# Patient Record
Sex: Female | Born: 1970 | Race: White | Hispanic: No | Marital: Single | Smoking: Never smoker
Health system: Southern US, Community
[De-identification: ages and names within clinical notes are randomized; demographics above are authoritative.]

## PROBLEM LIST (undated history)

## (undated) DIAGNOSIS — F419 Anxiety disorder, unspecified: Secondary | ICD-10-CM

## (undated) DIAGNOSIS — D649 Anemia, unspecified: Secondary | ICD-10-CM

## (undated) DIAGNOSIS — C4491 Basal cell carcinoma of skin, unspecified: Secondary | ICD-10-CM

## (undated) DIAGNOSIS — F329 Major depressive disorder, single episode, unspecified: Secondary | ICD-10-CM

## (undated) DIAGNOSIS — T4145XA Adverse effect of unspecified anesthetic, initial encounter: Secondary | ICD-10-CM

## (undated) DIAGNOSIS — T8859XA Other complications of anesthesia, initial encounter: Secondary | ICD-10-CM

## (undated) DIAGNOSIS — G4730 Sleep apnea, unspecified: Secondary | ICD-10-CM

## (undated) DIAGNOSIS — R51 Headache: Secondary | ICD-10-CM

## (undated) DIAGNOSIS — D219 Benign neoplasm of connective and other soft tissue, unspecified: Secondary | ICD-10-CM

## (undated) DIAGNOSIS — F32A Depression, unspecified: Secondary | ICD-10-CM

## (undated) HISTORY — PX: DILATION AND CURETTAGE OF UTERUS: SHX78

## (undated) HISTORY — PX: LIPOMA EXCISION: SHX5283

---
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
## 2007-03-14 HISTORY — PX: ANKLE RECONSTRUCTION: SHX1151

## 2008-02-08 ENCOUNTER — Emergency Department (HOSPITAL_COMMUNITY): Admission: EM | Admit: 2008-02-08 | Discharge: 2008-02-08 | Payer: Self-pay | Admitting: Emergency Medicine

## 2009-03-13 HISTORY — PX: OTHER SURGICAL HISTORY: SHX169

## 2009-12-17 ENCOUNTER — Encounter: Admission: RE | Admit: 2009-12-17 | Discharge: 2009-12-17 | Payer: Self-pay | Admitting: Family Medicine

## 2010-01-17 ENCOUNTER — Encounter: Admission: RE | Admit: 2010-01-17 | Discharge: 2010-01-17 | Payer: Self-pay | Admitting: Obstetrics and Gynecology

## 2010-01-19 ENCOUNTER — Ambulatory Visit (HOSPITAL_COMMUNITY): Admission: RE | Admit: 2010-01-19 | Discharge: 2010-01-19 | Payer: Self-pay | Admitting: Obstetrics and Gynecology

## 2010-05-24 LAB — CBC
HCT: 33.9 % — ABNORMAL LOW (ref 36.0–46.0)
Hemoglobin: 11.2 g/dL — ABNORMAL LOW (ref 12.0–15.0)
MCH: 25.1 pg — ABNORMAL LOW (ref 26.0–34.0)
MCHC: 33 g/dL (ref 30.0–36.0)
MCV: 76 fL — ABNORMAL LOW (ref 78.0–100.0)
Platelets: 251 10*3/uL (ref 150–400)
RBC: 4.46 MIL/uL (ref 3.87–5.11)
RDW: 17.7 % — ABNORMAL HIGH (ref 11.5–15.5)
WBC: 5.1 10*3/uL (ref 4.0–10.5)

## 2010-05-24 LAB — PREGNANCY, URINE: Preg Test, Ur: NEGATIVE

## 2012-02-20 ENCOUNTER — Other Ambulatory Visit: Payer: Self-pay | Admitting: Family Medicine

## 2012-02-20 DIAGNOSIS — D249 Benign neoplasm of unspecified breast: Secondary | ICD-10-CM

## 2012-02-21 ENCOUNTER — Other Ambulatory Visit: Payer: Self-pay | Admitting: Family Medicine

## 2012-02-21 DIAGNOSIS — D219 Benign neoplasm of connective and other soft tissue, unspecified: Secondary | ICD-10-CM

## 2012-02-22 ENCOUNTER — Ambulatory Visit
Admission: RE | Admit: 2012-02-22 | Discharge: 2012-02-22 | Disposition: A | Discharge status: Home / Self Care | Payer: 59 | Source: Ambulatory Visit | Attending: Family Medicine | Admitting: Family Medicine

## 2012-02-22 DIAGNOSIS — D219 Benign neoplasm of connective and other soft tissue, unspecified: Secondary | ICD-10-CM

## 2012-02-23 ENCOUNTER — Other Ambulatory Visit: Payer: Self-pay | Admitting: Family Medicine

## 2012-02-23 DIAGNOSIS — D259 Leiomyoma of uterus, unspecified: Secondary | ICD-10-CM

## 2012-02-28 ENCOUNTER — Other Ambulatory Visit: Payer: Self-pay

## 2012-02-29 ENCOUNTER — Ambulatory Visit
Admission: RE | Admit: 2012-02-29 | Discharge: 2012-02-29 | Disposition: A | Discharge status: Home / Self Care | Payer: 59 | Source: Ambulatory Visit | Attending: Family Medicine | Admitting: Family Medicine

## 2012-02-29 DIAGNOSIS — D259 Leiomyoma of uterus, unspecified: Secondary | ICD-10-CM

## 2012-02-29 MED ORDER — GADOBENATE DIMEGLUMINE 529 MG/ML IV SOLN
20.0000 mL | Freq: Once | INTRAVENOUS | Status: AC | PRN
  Administered 2012-02-29: 20 mL via INTRAVENOUS

## 2012-03-07 ENCOUNTER — Ambulatory Visit
Admission: RE | Admit: 2012-03-07 | Discharge: 2012-03-07 | Disposition: A | Discharge status: Home / Self Care | Payer: 59 | Source: Ambulatory Visit | Attending: Family Medicine | Admitting: Family Medicine

## 2012-03-07 DIAGNOSIS — D249 Benign neoplasm of unspecified breast: Secondary | ICD-10-CM

## 2012-04-16 ENCOUNTER — Inpatient Hospital Stay (HOSPITAL_COMMUNITY)
Admission: AD | Admit: 2012-04-16 | Discharge: 2012-04-16 | Disposition: A | Discharge status: Home / Self Care | Payer: 59 | Source: Ambulatory Visit | Attending: Family Medicine | Admitting: Family Medicine

## 2012-04-16 ENCOUNTER — Encounter (HOSPITAL_COMMUNITY): Payer: Self-pay | Admitting: *Deleted

## 2012-04-16 DIAGNOSIS — D259 Leiomyoma of uterus, unspecified: Secondary | ICD-10-CM | POA: Insufficient documentation

## 2012-04-16 DIAGNOSIS — D219 Benign neoplasm of connective and other soft tissue, unspecified: Secondary | ICD-10-CM

## 2012-04-16 DIAGNOSIS — N949 Unspecified condition associated with female genital organs and menstrual cycle: Secondary | ICD-10-CM

## 2012-04-16 DIAGNOSIS — N938 Other specified abnormal uterine and vaginal bleeding: Secondary | ICD-10-CM

## 2012-04-16 DIAGNOSIS — N939 Abnormal uterine and vaginal bleeding, unspecified: Secondary | ICD-10-CM

## 2012-04-16 DIAGNOSIS — N920 Excessive and frequent menstruation with regular cycle: Secondary | ICD-10-CM | POA: Insufficient documentation

## 2012-04-16 HISTORY — DX: Benign neoplasm of connective and other soft tissue, unspecified: D21.9

## 2012-04-16 HISTORY — DX: Major depressive disorder, single episode, unspecified: F32.9

## 2012-04-16 HISTORY — DX: Depression, unspecified: F32.A

## 2012-04-16 LAB — CBC WITH DIFFERENTIAL/PLATELET
Basophils Absolute: 0 10*3/uL (ref 0.0–0.1)
Basophils Relative: 1 % (ref 0–1)
Eosinophils Absolute: 0.1 10*3/uL (ref 0.0–0.7)
Eosinophils Relative: 2 % (ref 0–5)
HCT: 38.5 % (ref 36.0–46.0)
Hemoglobin: 13.3 g/dL (ref 12.0–15.0)
Lymphocytes Relative: 28 % (ref 12–46)
Lymphs Abs: 1.7 10*3/uL (ref 0.7–4.0)
MCH: 28.2 pg (ref 26.0–34.0)
MCHC: 34.5 g/dL (ref 30.0–36.0)
MCV: 81.7 fL (ref 78.0–100.0)
Monocytes Absolute: 0.4 10*3/uL (ref 0.1–1.0)
Monocytes Relative: 6 % (ref 3–12)
Neutro Abs: 3.9 10*3/uL (ref 1.7–7.7)
Neutrophils Relative %: 63 % (ref 43–77)
Platelets: 230 10*3/uL (ref 150–400)
RBC: 4.71 MIL/uL (ref 3.87–5.11)
RDW: 13.4 % (ref 11.5–15.5)
WBC: 6.1 10*3/uL (ref 4.0–10.5)

## 2012-04-16 LAB — COMPREHENSIVE METABOLIC PANEL
ALT: 16 U/L (ref 0–35)
AST: 21 U/L (ref 0–37)
Albumin: 4 g/dL (ref 3.5–5.2)
Alkaline Phosphatase: 56 U/L (ref 39–117)
BUN: 15 mg/dL (ref 6–23)
CO2: 26 mEq/L (ref 19–32)
Calcium: 9.2 mg/dL (ref 8.4–10.5)
Chloride: 103 mEq/L (ref 96–112)
Creatinine, Ser: 0.71 mg/dL (ref 0.50–1.10)
GFR calc Af Amer: >90 mL/min (ref >90)
GFR calc non Af Amer: >90 mL/min (ref >90)
Glucose, Bld: 90 mg/dL (ref 70–99)
Potassium: 4.2 mEq/L (ref 3.5–5.1)
Sodium: 139 mEq/L (ref 135–145)
Total Bilirubin: 0.3 mg/dL (ref 0.3–1.2)
Total Protein: 7.3 g/dL (ref 6.0–8.3)

## 2012-04-16 LAB — URINALYSIS, ROUTINE W REFLEX MICROSCOPIC
Bilirubin Urine: NEGATIVE
Glucose, UA: NEGATIVE mg/dL
Ketones, ur: NEGATIVE mg/dL
Leukocytes, UA: NEGATIVE
Nitrite: NEGATIVE
Protein, ur: NEGATIVE mg/dL
Specific Gravity, Urine: 1.025 (ref 1.005–1.030)
Urobilinogen, UA: 0.2 mg/dL (ref 0.0–1.0)
pH: 6 (ref 5.0–8.0)

## 2012-04-16 LAB — CBC
HCT: 36.2 % (ref 36.0–46.0)
Hemoglobin: 12.5 g/dL (ref 12.0–15.0)
MCH: 28 pg (ref 26.0–34.0)
MCHC: 34.5 g/dL (ref 30.0–36.0)
MCV: 81.2 fL (ref 78.0–100.0)
Platelets: 219 10*3/uL (ref 150–400)
RBC: 4.46 MIL/uL (ref 3.87–5.11)
RDW: 14 % (ref 11.5–15.5)
WBC: 6.2 10*3/uL (ref 4.0–10.5)

## 2012-04-16 LAB — WET PREP, GENITAL
Clue Cells Wet Prep HPF POC: NONE SEEN
Trich, Wet Prep: NONE SEEN
WBC, Wet Prep HPF POC: NONE SEEN
Yeast Wet Prep HPF POC: NONE SEEN

## 2012-04-16 LAB — ABO/RH: ABO/RH(D): O NEG

## 2012-04-16 LAB — URINE MICROSCOPIC-ADD ON

## 2012-04-16 LAB — POCT PREGNANCY, URINE: Preg Test, Ur: NEGATIVE

## 2012-04-16 NOTE — MAU Note (Signed)
Kaitlyn Reid is a 42 year-old female transferred here from [place] due to heavy vaginal bleeding. She has been bleeding for approximately 6 months. She was scheduled to see Dr. Gunnar Bulla this month. This morning she woke up with heavy vaginal bleeding and around 1500 she was gushing blood. She went to [facility] ED. In 2011 she had an ablation done by [facility].

## 2012-04-16 NOTE — ED Provider Notes (Signed)
History     CSN: [id number]  Arrival date & time 04/16/12  1720   First MD Initiated Contact with Patient 04/16/12 1840      Chief Complaint  Patient presents with  . Vaginal Bleeding    (Consider location/radiation/quality/duration/timing/severity/associated sxs/prior treatment) Patient is a 42 y.o. female presenting with vaginal bleeding. The history is provided by the patient.  Vaginal Bleeding   patient here with vaginal bleeding became worse today. She has been passing copious amounts of clots. She is slightly light headed and dizzy. She has a known history of uterine fibroids and has had ablation surgery in the past. No fever or vomiting. No flank pain. No urinary symptoms. She currently does not have a GYN doctor. Symptoms have been persistent and nothing makes them better or worse  Past Medical History  Diagnosis Date  . Fibroids   . Depression     Past Surgical History  Procedure Date  . Ablation 2011    uterine  . Ankle reconstruction 2009    R    No family history on file.  History  Substance Use Topics  . Smoking status: Never Smoker   . Smokeless tobacco: Not on file  . Alcohol Use: 3.6 oz/week    6 Glasses of wine per week     Comment: occasionally    OB History    Grav Para Term Preterm Abortions TAB SAB Ect Mult Living                  Review of Systems  Genitourinary: Positive for vaginal bleeding.  All other systems reviewed and are negative.    Allergies  Review of patient's allergies indicates no known allergies.  Home Medications   Current Outpatient Rx  Name  Route  Sig  Dispense  Refill  . VITAMIN D PO   Oral   Take 1 capsule by mouth daily. 1200mg  daily         . FLUOXETINE HCL 40 MG PO CAPS   Oral   Take 40 mg by mouth 2 (two) times daily.         . ADULT MULTIVITAMIN W/MINERALS CH   Oral   Take 1 tablet by mouth daily.           BP 161/93  Pulse 76  Temp 98.2 F (36.8 C) (Oral)  Resp 20  SpO2  98%  Physical Exam  Nursing note and vitals reviewed. Constitutional: She is oriented to person, place, and time. She appears well-developed and well-nourished.  Non-toxic appearance. No distress.  HENT:  Head: Normocephalic and atraumatic.  Eyes: Conjunctivae normal, EOM and lids are normal. Pupils are equal, round, and reactive to light.  Neck: Normal range of motion. Neck supple. No tracheal deviation present. No mass present.  Cardiovascular: Normal rate, regular rhythm and normal heart sounds.  Exam reveals no gallop.   No murmur heard. Pulmonary/Chest: Effort normal and breath sounds normal. No stridor. No respiratory distress. She has no decreased breath sounds. She has no wheezes. She has no rhonchi. She has no rales.  Abdominal: Soft. Normal appearance and bowel sounds are normal. She exhibits no distension. There is no tenderness. There is no rebound and no CVA tenderness.  Genitourinary:       Copious amounts of blood clots noted  Musculoskeletal: Normal range of motion. She exhibits no edema and no tenderness.  Neurological: She is alert and oriented to person, place, and time. She has normal strength. No cranial nerve deficit  or sensory deficit. GCS eye subscore is 4. GCS verbal subscore is 5. GCS motor subscore is 6.  Skin: Skin is warm and dry. No abrasion and no rash noted.  Psychiatric: She has a normal mood and affect. Her speech is normal and behavior is normal.    ED Course  Procedures (including critical care time)   Labs Reviewed  CBC WITH DIFFERENTIAL  URINALYSIS, ROUTINE W REFLEX MICROSCOPIC  COMPREHENSIVE METABOLIC PANEL  TYPE AND SCREEN   No results found.   No diagnosis found.    MDM  Spoke with Dr. Shawnie Pons and patient to be transferred to [facility] hospital for evaluation        Toy Baker, MD 04/16/12 [phone]

## 2012-04-16 NOTE — ED Notes (Signed)
Pt with hx of fibroid removal to ED c/o increased vaginal bleeding.  She states she normally bleeds continually, but today "the bottom fell out". Pt states she is on her 4th overnight pad.  She is awaiting a consult with a Careers adviser.

## 2012-04-16 NOTE — MAU Provider Note (Signed)
History     CSN: [id number]  Arrival date and time: 04/16/12 4098   First Provider Initiated Contact with Patient 04/16/12 2153      Chief Complaint  Patient presents with  . Vaginal Bleeding   HPI  Pt is not pregnant and presents with heavy bleeding.  Pt has known fibroids and had an endometrial ablation in 2011 by Dr. Malva Limes.  Pt had decreased bleeding after her ablation for about 6 to 8 months.  Pt 's bleeding started getting heavier in October and bleeding about 3 weeks out of the month.  In December she started feeling pain in addition to her bleeding.  At that time pt had a pelvic MRI and Today pt had gushing of blood with large clots and feeling light headed and went to [facility] ED.  The ED physician felt uncomfortable with the amount of bleeding and transferred pt over to [facility] MAU. Pt took Ibuprofen yesterday with some improvement but has not taken anything today and pt does not want any pain med at this now.  Pt has an appointment with Dr. Dion Body at the end of this month but has not established care with her at this time. Pt denies constipation, diarrhea, UTI symptoms, fever, chills.  Past Medical History  Diagnosis Date  . Fibroids   . Depression     Past Surgical History  Procedure Date  . Ablation 2011    uterine  . Ankle reconstruction 2009    R    No family history on file.  History  Substance Use Topics  . Smoking status: Never Smoker   . Smokeless tobacco: Not on file  . Alcohol Use: 3.6 oz/week    6 Glasses of wine per week     Comment: occasionally    Allergies: No Known Allergies  Prescriptions prior to admission  Medication Sig Dispense Refill  . Cholecalciferol (VITAMIN D PO) Take 1 capsule by mouth daily. 1200mg  daily      . FLUoxetine (PROZAC) 40 MG capsule Take 40 mg by mouth 2 (two) times daily.      . Multiple Vitamin (MULTIVITAMIN WITH MINERALS) TABS Take 1 tablet by mouth daily.        Review of Systems  Constitutional: Negative for  fever and chills.  Gastrointestinal: Positive for abdominal pain. Negative for nausea, vomiting, diarrhea and constipation.  Genitourinary: Negative for dysuria, urgency and frequency.  Neurological: Negative for headaches.   Physical Exam   Blood pressure 152/88, pulse 76, temperature 98.3 F (36.8 C), temperature source Oral, resp. rate 18, SpO2 100.00%.  Physical Exam  Nursing note and vitals reviewed. Constitutional: She is oriented to person, place, and time. She appears well-developed and well-nourished. No distress.  HENT:  Head: Normocephalic.  Eyes: Pupils are equal, round, and reactive to light.  Neck: Normal range of motion. Neck supple.  Cardiovascular: Normal rate.   Respiratory: Effort normal.  GI: Soft. She exhibits no distension. There is no tenderness. There is no rebound.  Genitourinary:       Small amount of dark red blood from os- Uterus enlarged irregular - multiple fibroids  Musculoskeletal: Normal range of motion.  Neurological: She is alert and oriented to person, place, and time.  Skin: Skin is warm and dry.  Psychiatric: She has a normal mood and affect.    MAU Course  Procedures Results for orders placed during the hospital encounter of 04/16/12 (from the past 24 hour(s))  CBC WITH DIFFERENTIAL     Status:  Normal   Collection Time   04/16/12  5:53 PM      Component Value Range   WBC 6.1  4.0 - 10.5 K/uL   RBC 4.71  3.87 - 5.11 MIL/uL   Hemoglobin 13.3  12.0 - 15.0 g/dL   HCT 95.6  21.3 - 08.6 %   MCV 81.7  78.0 - 100.0 fL   MCH 28.2  26.0 - 34.0 pg   MCHC 34.5  30.0 - 36.0 g/dL   RDW 57.8  46.9 - 62.9 %   Platelets 230  150 - 400 K/uL   Neutrophils Relative 63  43 - 77 %   Neutro Abs 3.9  1.7 - 7.7 K/uL   Lymphocytes Relative 28  12 - 46 %   Lymphs Abs 1.7  0.7 - 4.0 K/uL   Monocytes Relative 6  3 - 12 %   Monocytes Absolute 0.4  0.1 - 1.0 K/uL   Eosinophils Relative 2  0 - 5 %   Eosinophils Absolute 0.1  0.0 - 0.7 K/uL   Basophils  Relative 1  0 - 1 %   Basophils Absolute 0.0  0.0 - 0.1 K/uL  COMPREHENSIVE METABOLIC PANEL     Status: Normal   Collection Time   04/16/12  6:45 PM      Component Value Range   Sodium 139  135 - 145 mEq/L   Potassium 4.2  3.5 - 5.1 mEq/L   Chloride 103  96 - 112 mEq/L   CO2 26  19 - 32 mEq/L   Glucose, Bld 90  70 - 99 mg/dL   BUN 15  6 - 23 mg/dL   Creatinine, Ser 5.28  0.50 - 1.10 mg/dL   Calcium 9.2  8.4 - 41.3 mg/dL   Total Protein 7.3  6.0 - 8.3 g/dL   Albumin 4.0  3.5 - 5.2 g/dL   AST 21  0 - 37 U/L   ALT 16  0 - 35 U/L   Alkaline Phosphatase 56  39 - 117 U/L   Total Bilirubin 0.3  0.3 - 1.2 mg/dL   GFR calc non Af Amer >90  >90 mL/min   GFR calc Af Amer >90  >90 mL/min  TYPE AND SCREEN     Status: Normal   Collection Time   04/16/12  7:00 PM      Component Value Range   ABO/RH(D) O NEG     Antibody Screen NEG     Sample Expiration 04/19/2012    ABO/RH     Status: Normal   Collection Time   04/16/12  7:00 PM      Component Value Range   ABO/RH(D) O NEG    CBC     Status: Normal   Collection Time   04/16/12  9:50 PM      Component Value Range   WBC 6.2  4.0 - 10.5 K/uL   RBC 4.46  3.87 - 5.11 MIL/uL   Hemoglobin 12.5  12.0 - 15.0 g/dL   HCT 24.4  01.0 - 27.2 %   MCV 81.2  78.0 - 100.0 fL   MCH 28.0  26.0 - 34.0 pg   MCHC 34.5  30.0 - 36.0 g/dL   RDW 53.6  64.4 - 03.4 %   Platelets 219  150 - 400 K/uL  WET PREP, GENITAL     Status: Normal   Collection Time   04/16/12 10:15 PM      Component Value Range   Yeast  Wet Prep HPF POC NONE SEEN  NONE SEEN   Trich, Wet Prep NONE SEEN  NONE SEEN   Clue Cells Wet Prep HPF POC NONE SEEN  NONE SEEN   WBC, Wet Prep HPF POC NONE SEEN  NONE SEEN  POCT PREGNANCY, URINE     Status: Normal   Collection Time   04/16/12 10:27 PM      Component Value Range   Preg Test, Ur NEGATIVE  NEGATIVE  discussed with pt and partner options- pt refused any hormones including any progesterone Pt will f/u with physician of choice- return to MAU if  recurrence of heavy bleeding prior to appt Pt is stable and bleeding minimal at time of discharge Review of records showed inadequate U/S due to fibroids and MRI 02/2012, therefore U/S deferred   Assessment and Plan  Menorrhagia Fibroid uterus F/u with provider of choice Jakarius Flamenco 04/16/2012, 9:53 PM

## 2012-04-16 NOTE — ED Notes (Signed)
[facility] contacted for transfer

## 2012-04-17 LAB — TYPE AND SCREEN
ABO/RH(D): O NEG
Antibody Screen: NEGATIVE

## 2012-04-17 LAB — GC/CHLAMYDIA PROBE AMP
CT Probe RNA: NEGATIVE
GC Probe RNA: NEGATIVE

## 2012-04-17 NOTE — MAU Provider Note (Signed)
Chart reviewed and agree with management and plan.  

## 2012-04-18 ENCOUNTER — Other Ambulatory Visit (HOSPITAL_COMMUNITY)
Admission: RE | Admit: 2012-04-18 | Discharge: 2012-04-18 | Disposition: A | Discharge status: Home / Self Care | Payer: 59 | Source: Ambulatory Visit | Attending: Obstetrics and Gynecology | Admitting: Obstetrics and Gynecology

## 2012-04-18 ENCOUNTER — Other Ambulatory Visit: Payer: Self-pay | Admitting: Obstetrics and Gynecology

## 2012-04-18 DIAGNOSIS — Z01419 Encounter for gynecological examination (general) (routine) without abnormal findings: Secondary | ICD-10-CM | POA: Insufficient documentation

## 2012-04-18 DIAGNOSIS — Z1151 Encounter for screening for human papillomavirus (HPV): Secondary | ICD-10-CM | POA: Insufficient documentation

## 2012-04-22 ENCOUNTER — Other Ambulatory Visit: Payer: Self-pay | Admitting: Obstetrics and Gynecology

## 2012-05-14 ENCOUNTER — Encounter (HOSPITAL_COMMUNITY): Payer: Self-pay | Admitting: Pharmacist

## 2012-05-21 ENCOUNTER — Other Ambulatory Visit: Payer: Self-pay | Admitting: Obstetrics and Gynecology

## 2012-05-21 ENCOUNTER — Encounter (HOSPITAL_COMMUNITY)
Admission: RE | Admit: 2012-05-21 | Discharge: 2012-05-21 | Disposition: A | Discharge status: Home / Self Care | Payer: 59 | Source: Ambulatory Visit | Attending: Obstetrics and Gynecology | Admitting: Obstetrics and Gynecology

## 2012-05-21 ENCOUNTER — Encounter (HOSPITAL_COMMUNITY): Payer: Self-pay

## 2012-05-21 HISTORY — DX: Other complications of anesthesia, initial encounter: T88.59XA

## 2012-05-21 HISTORY — DX: Adverse effect of unspecified anesthetic, initial encounter: T41.45XA

## 2012-05-21 HISTORY — DX: Anemia, unspecified: D64.9

## 2012-05-21 HISTORY — DX: Sleep apnea, unspecified: G47.30

## 2012-05-21 HISTORY — DX: Headache: R51

## 2012-05-21 HISTORY — DX: Anxiety disorder, unspecified: F41.9

## 2012-05-21 LAB — CBC
HCT: 37.2 % (ref 36.0–46.0)
Hemoglobin: 12.2 g/dL (ref 12.0–15.0)
MCH: 27.4 pg (ref 26.0–34.0)
MCHC: 32.8 g/dL (ref 30.0–36.0)
MCV: 83.4 fL (ref 78.0–100.0)
Platelets: 262 10*3/uL (ref 150–400)
RBC: 4.46 MIL/uL (ref 3.87–5.11)
RDW: 13.6 % (ref 11.5–15.5)
WBC: 4.8 10*3/uL (ref 4.0–10.5)

## 2012-05-21 LAB — SURGICAL PCR SCREEN
MRSA, PCR: INVALID — AB
Staphylococcus aureus: INVALID — AB

## 2012-05-21 NOTE — Patient Instructions (Signed)
Your procedure is scheduled on:05/28/12  Enter through the Main Entrance at :0600 am Pick up desk phone and dial [phone] and inform us of your arrival.  Please call [phone] if you have any problems the morning of surgery.  Remember: Do not eat or drink after midnight:Monday   Take these meds the morning of surgery with a sip of water:usual am meds  DO NOT wear jewelry, eye make-up, lipstick,body lotion, or dark fingernail polish. Do not shave for 48 hours prior to surgery.  If you are to be admitted after surgery, leave suitcase in car until your room has been assigned. Patients discharged on the day of surgery will not be allowed to drive home.

## 2012-05-25 LAB — MRSA CULTURE

## 2012-05-28 ENCOUNTER — Observation Stay (HOSPITAL_COMMUNITY)
Admission: RE | Admit: 2012-05-28 | Discharge: 2012-05-29 | Disposition: A | Discharge status: Home / Self Care | Payer: 59 | Source: Ambulatory Visit | Attending: Obstetrics and Gynecology | Admitting: Obstetrics and Gynecology

## 2012-05-28 ENCOUNTER — Encounter (HOSPITAL_COMMUNITY): Payer: Self-pay | Admitting: *Deleted

## 2012-05-28 ENCOUNTER — Encounter (HOSPITAL_COMMUNITY): Payer: Self-pay | Admitting: Anesthesiology

## 2012-05-28 ENCOUNTER — Ambulatory Visit (HOSPITAL_COMMUNITY): Payer: 59 | Admitting: Anesthesiology

## 2012-05-28 ENCOUNTER — Encounter (HOSPITAL_COMMUNITY)
Admission: RE | Disposition: A | Discharge status: Home / Self Care | Payer: Self-pay | Source: Ambulatory Visit | Attending: Obstetrics and Gynecology

## 2012-05-28 DIAGNOSIS — D252 Subserosal leiomyoma of uterus: Secondary | ICD-10-CM | POA: Insufficient documentation

## 2012-05-28 DIAGNOSIS — N949 Unspecified condition associated with female genital organs and menstrual cycle: Secondary | ICD-10-CM | POA: Insufficient documentation

## 2012-05-28 DIAGNOSIS — Z90710 Acquired absence of both cervix and uterus: Secondary | ICD-10-CM

## 2012-05-28 DIAGNOSIS — D251 Intramural leiomyoma of uterus: Principal | ICD-10-CM | POA: Insufficient documentation

## 2012-05-28 DIAGNOSIS — N938 Other specified abnormal uterine and vaginal bleeding: Secondary | ICD-10-CM | POA: Insufficient documentation

## 2012-05-28 HISTORY — PX: CYSTOSCOPY: SHX5120

## 2012-05-28 HISTORY — PX: ROBOTIC ASSISTED TOTAL HYSTERECTOMY: SHX6085

## 2012-05-28 LAB — PREGNANCY, URINE: Preg Test, Ur: NEGATIVE

## 2012-05-28 LAB — TYPE AND SCREEN
ABO/RH(D): O NEG
Antibody Screen: NEGATIVE

## 2012-05-28 LAB — ABO/RH: ABO/RH(D): O NEG

## 2012-05-28 SURGERY — ROBOTIC ASSISTED TOTAL HYSTERECTOMY
Anesthesia: General | Wound class: Clean Contaminated

## 2012-05-28 MED ORDER — EPHEDRINE SULFATE 50 MG/ML IJ SOLN
INTRAMUSCULAR | Status: DC | PRN
  Administered 2012-05-28 (×5): 10 mg via INTRAVENOUS

## 2012-05-28 MED ORDER — ONDANSETRON HCL 4 MG/2ML IJ SOLN
INTRAMUSCULAR | Status: DC | PRN
  Administered 2012-05-28: 4 mg via INTRAVENOUS

## 2012-05-28 MED ORDER — MUPIROCIN 2 % EX OINT
TOPICAL_OINTMENT | CUTANEOUS | Status: AC
  Filled 2012-05-28: qty 22

## 2012-05-28 MED ORDER — MIDAZOLAM HCL 5 MG/5ML IJ SOLN
INTRAMUSCULAR | Status: DC | PRN
  Administered 2012-05-28: 2 mg via INTRAVENOUS

## 2012-05-28 MED ORDER — FENTANYL CITRATE 0.05 MG/ML IJ SOLN
INTRAMUSCULAR | Status: AC
  Filled 2012-05-28: qty 5

## 2012-05-28 MED ORDER — DEXTROSE 5 % IV SOLN: 3.0000 g | INTRAVENOUS | Status: DC

## 2012-05-28 MED ORDER — DOCUSATE SODIUM 100 MG PO CAPS: 100.0000 mg | ORAL_CAPSULE | Freq: Two times a day (BID) | ORAL | Status: DC

## 2012-05-28 MED ORDER — SCOPOLAMINE 1 MG/3DAYS TD PT72
1.0000 | MEDICATED_PATCH | Freq: Once | TRANSDERMAL | Status: DC
  Administered 2012-05-28: 1.5 mg via TRANSDERMAL

## 2012-05-28 MED ORDER — EPHEDRINE 5 MG/ML INJ
INTRAVENOUS | Status: AC
  Filled 2012-05-28: qty 10

## 2012-05-28 MED ORDER — MIDAZOLAM HCL 2 MG/2ML IJ SOLN
INTRAMUSCULAR | Status: AC
  Filled 2012-05-28: qty 2

## 2012-05-28 MED ORDER — INDIGOTINDISULFONATE SODIUM 8 MG/ML IJ SOLN
INTRAMUSCULAR | Status: DC | PRN
  Administered 2012-05-28: 5 mL via INTRAVENOUS

## 2012-05-28 MED ORDER — SCOPOLAMINE 1 MG/3DAYS TD PT72
MEDICATED_PATCH | TRANSDERMAL | Status: AC
  Filled 2012-05-28: qty 1

## 2012-05-28 MED ORDER — PROPOFOL 10 MG/ML IV EMUL
INTRAVENOUS | Status: AC
  Filled 2012-05-28: qty 40

## 2012-05-28 MED ORDER — ONDANSETRON HCL 4 MG/2ML IJ SOLN
INTRAMUSCULAR | Status: AC
  Filled 2012-05-28: qty 2

## 2012-05-28 MED ORDER — OXYCODONE-ACETAMINOPHEN 5-325 MG PO TABS: 1.0000 | ORAL_TABLET | ORAL | Status: DC | PRN

## 2012-05-28 MED ORDER — STERILE WATER FOR IRRIGATION IR SOLN
Status: DC | PRN
  Administered 2012-05-28: 09:00:00 via INTRAVESICAL

## 2012-05-28 MED ORDER — DEXTROSE 5 % IV SOLN
3.0000 g | INTRAVENOUS | Status: AC
  Administered 2012-05-28: 3 g via INTRAVENOUS
  Filled 2012-05-28: qty 3000

## 2012-05-28 MED ORDER — HYDROMORPHONE HCL PF 1 MG/ML IJ SOLN
INTRAMUSCULAR | Status: AC
  Filled 2012-05-28: qty 1

## 2012-05-28 MED ORDER — LIDOCAINE HCL (CARDIAC) 20 MG/ML IV SOLN
INTRAVENOUS | Status: DC | PRN
  Administered 2012-05-28: 50 mg via INTRAVENOUS

## 2012-05-28 MED ORDER — GLYCOPYRROLATE 0.2 MG/ML IJ SOLN
INTRAMUSCULAR | Status: DC | PRN
  Administered 2012-05-28: 0.4 mg via INTRAVENOUS
  Administered 2012-05-28: 0.2 mg via INTRAVENOUS

## 2012-05-28 MED ORDER — KETOROLAC TROMETHAMINE 30 MG/ML IJ SOLN
INTRAMUSCULAR | Status: DC | PRN
  Administered 2012-05-28: 30 mg via INTRAVENOUS

## 2012-05-28 MED ORDER — ROPIVACAINE HCL 5 MG/ML IJ SOLN
INTRAMUSCULAR | Status: AC
  Filled 2012-05-28: qty 60

## 2012-05-28 MED ORDER — KETOROLAC TROMETHAMINE 30 MG/ML IJ SOLN: 30.0000 mg | Freq: Four times a day (QID) | INTRAMUSCULAR | Status: DC

## 2012-05-28 MED ORDER — METHYLENE BLUE 1 % INJ SOLN
INTRAMUSCULAR | Status: AC
  Filled 2012-05-28: qty 1

## 2012-05-28 MED ORDER — NEOSTIGMINE METHYLSULFATE 1 MG/ML IJ SOLN
INTRAMUSCULAR | Status: DC | PRN
  Administered 2012-05-28: 3 mg via INTRAVENOUS

## 2012-05-28 MED ORDER — PROPOFOL 10 MG/ML IV EMUL
INTRAVENOUS | Status: DC | PRN
  Administered 2012-05-28: 200 mg via INTRAVENOUS
  Administered 2012-05-28 (×2): 30 mg via INTRAVENOUS

## 2012-05-28 MED ORDER — HYDROMORPHONE HCL PF 1 MG/ML IJ SOLN
INTRAMUSCULAR | Status: DC | PRN
  Administered 2012-05-28: 1 mg via INTRAVENOUS

## 2012-05-28 MED ORDER — ACETAMINOPHEN 10 MG/ML IV SOLN
1000.0000 mg | Freq: Once | INTRAVENOUS | Status: DC
  Filled 2012-05-28: qty 100

## 2012-05-28 MED ORDER — LACTATED RINGERS IR SOLN
Status: DC | PRN
  Administered 2012-05-28: 3000 mL

## 2012-05-28 MED ORDER — MUPIROCIN 2 % EX OINT: TOPICAL_OINTMENT | Freq: Two times a day (BID) | CUTANEOUS | Status: DC

## 2012-05-28 MED ORDER — ACETAMINOPHEN 10 MG/ML IV SOLN
INTRAVENOUS | Status: AC
  Filled 2012-05-28: qty 100

## 2012-05-28 MED ORDER — LACTATED RINGERS IV SOLN
INTRAVENOUS | Status: DC
  Administered 2012-05-28 (×2): via INTRAVENOUS

## 2012-05-28 MED ORDER — KETOROLAC TROMETHAMINE 30 MG/ML IJ SOLN
30.0000 mg | Freq: Four times a day (QID) | INTRAMUSCULAR | Status: DC
  Administered 2012-05-28 – 2012-05-29 (×3): 30 mg via INTRAVENOUS
  Filled 2012-05-28 (×3): qty 1

## 2012-05-28 MED ORDER — KETOROLAC TROMETHAMINE 30 MG/ML IJ SOLN
INTRAMUSCULAR | Status: AC
  Filled 2012-05-28: qty 1

## 2012-05-28 MED ORDER — FENTANYL CITRATE 0.05 MG/ML IJ SOLN
INTRAMUSCULAR | Status: DC | PRN
  Administered 2012-05-28: 150 ug via INTRAVENOUS
  Administered 2012-05-28 (×2): 50 ug via INTRAVENOUS
  Administered 2012-05-28 (×2): 100 ug via INTRAVENOUS
  Administered 2012-05-28: 50 ug via INTRAVENOUS
  Administered 2012-05-28: 100 ug via INTRAVENOUS

## 2012-05-28 MED ORDER — GLYCOPYRROLATE 0.2 MG/ML IJ SOLN
INTRAMUSCULAR | Status: AC
Start: 2012-05-28 — End: 2012-05-28
  Filled 2012-05-28: qty 3

## 2012-05-28 MED ORDER — ACETAMINOPHEN 10 MG/ML IV SOLN
INTRAVENOUS | Status: DC | PRN
  Administered 2012-05-28: 1000 mg via INTRAVENOUS

## 2012-05-28 MED ORDER — FENTANYL CITRATE 0.05 MG/ML IJ SOLN
INTRAMUSCULAR | Status: AC
  Filled 2012-05-28: qty 2

## 2012-05-28 MED ORDER — ARTIFICIAL TEARS OP OINT
TOPICAL_OINTMENT | OPHTHALMIC | Status: AC
  Filled 2012-05-28: qty 3.5

## 2012-05-28 MED ORDER — ROPIVACAINE HCL 5 MG/ML IJ SOLN
INTRAMUSCULAR | Status: DC | PRN
  Administered 2012-05-28: 120 mL via EPIDURAL

## 2012-05-28 MED ORDER — ALUM & MAG HYDROXIDE-SIMETH 200-200-20 MG/5ML PO SUSP: 30.0000 mL | ORAL | Status: DC | PRN

## 2012-05-28 MED ORDER — ONDANSETRON HCL 4 MG/2ML IJ SOLN: 4.0000 mg | Freq: Four times a day (QID) | INTRAMUSCULAR | Status: DC | PRN

## 2012-05-28 MED ORDER — HYDROMORPHONE HCL PF 1 MG/ML IJ SOLN
0.2500 mg | INTRAMUSCULAR | Status: DC | PRN
  Administered 2012-05-28 (×2): 0.5 mg via INTRAVENOUS

## 2012-05-28 MED ORDER — HYDROMORPHONE HCL PF 1 MG/ML IJ SOLN: 0.2000 mg | INTRAMUSCULAR | Status: DC | PRN

## 2012-05-28 MED ORDER — ROCURONIUM BROMIDE 50 MG/5ML IV SOLN
INTRAVENOUS | Status: AC
  Filled 2012-05-28: qty 1

## 2012-05-28 MED ORDER — LIDOCAINE HCL (CARDIAC) 20 MG/ML IV SOLN
INTRAVENOUS | Status: AC
  Filled 2012-05-28: qty 5

## 2012-05-28 MED ORDER — NEOSTIGMINE METHYLSULFATE 1 MG/ML IJ SOLN
INTRAMUSCULAR | Status: AC
  Filled 2012-05-28: qty 1

## 2012-05-28 MED ORDER — DEXAMETHASONE SODIUM PHOSPHATE 4 MG/ML IJ SOLN
INTRAMUSCULAR | Status: DC | PRN
  Administered 2012-05-28: 10 mg via INTRAVENOUS

## 2012-05-28 MED ORDER — LACTATED RINGERS IV SOLN
INTRAVENOUS | Status: DC
  Administered 2012-05-28 (×2): via INTRAVENOUS

## 2012-05-28 MED ORDER — STERILE WATER FOR IRRIGATION IR SOLN: Status: DC | PRN

## 2012-05-28 MED ORDER — ROCURONIUM BROMIDE 100 MG/10ML IV SOLN
INTRAVENOUS | Status: DC | PRN
  Administered 2012-05-28: 10 mg via INTRAVENOUS
  Administered 2012-05-28: 50 mg via INTRAVENOUS
  Administered 2012-05-28: 20 mg via INTRAVENOUS
  Administered 2012-05-28 (×2): 10 mg via INTRAVENOUS

## 2012-05-28 MED ORDER — ONDANSETRON HCL 4 MG PO TABS: 4.0000 mg | ORAL_TABLET | Freq: Four times a day (QID) | ORAL | Status: DC | PRN

## 2012-05-28 SURGICAL SUPPLY — 61 items
BAG URINE DRAINAGE (UROLOGICAL SUPPLIES) ×2 IMPLANT
BARRIER ADHS 3X4 INTERCEED (GAUZE/BANDAGES/DRESSINGS) ×2 IMPLANT
CATH FOLEY 3WAY  5CC 16FR (CATHETERS) ×1
CATH FOLEY 3WAY 5CC 16FR (CATHETERS) ×1 IMPLANT
CONT PATH 16OZ SNAP LID 3702 (MISCELLANEOUS) ×2 IMPLANT
COVER MAYO STAND STRL (DRAPES) ×2 IMPLANT
COVER TABLE BACK 60X90 (DRAPES) ×4 IMPLANT
COVER TIP SHEARS 8 DVNC (MISCELLANEOUS) ×1 IMPLANT
COVER TIP SHEARS 8MM DA VINCI (MISCELLANEOUS) ×1
DECANTER SPIKE VIAL GLASS SM (MISCELLANEOUS) ×2 IMPLANT
DERMABOND ADVANCED (GAUZE/BANDAGES/DRESSINGS) ×1
DERMABOND ADVANCED .7 DNX12 (GAUZE/BANDAGES/DRESSINGS) ×1 IMPLANT
DEVICE TROCAR PUNCTURE CLOSURE (ENDOMECHANICALS) ×2 IMPLANT
DILATOR CANAL MILEX (MISCELLANEOUS) ×2 IMPLANT
DRAPE HUG U DISPOSABLE (DRAPE) ×2 IMPLANT
DRAPE LG THREE QUARTER DISP (DRAPES) ×4 IMPLANT
DRAPE WARM FLUID 44X44 (DRAPE) ×2 IMPLANT
ELECT REM PT RETURN 9FT ADLT (ELECTROSURGICAL) ×2
ELECTRODE REM PT RTRN 9FT ADLT (ELECTROSURGICAL) ×1 IMPLANT
EVACUATOR SMOKE 8.L (FILTER) ×2 IMPLANT
GAUZE VASELINE 3X9 (GAUZE/BANDAGES/DRESSINGS) IMPLANT
GLOVE BIO SURGEON STRL SZ7 (GLOVE) ×16 IMPLANT
GLOVE BIOGEL M 6.5 STRL (GLOVE) ×6 IMPLANT
GLOVE BIOGEL PI IND STRL 6.5 (GLOVE) ×2 IMPLANT
GLOVE BIOGEL PI IND STRL 7.0 (GLOVE) ×5 IMPLANT
GLOVE BIOGEL PI INDICATOR 6.5 (GLOVE) ×2
GLOVE BIOGEL PI INDICATOR 7.0 (GLOVE) ×5
GOWN STRL REIN XL XLG (GOWN DISPOSABLE) ×12 IMPLANT
KIT ACCESSORY DA VINCI DISP (KITS) ×1
KIT ACCESSORY DVNC DISP (KITS) ×1 IMPLANT
LEGGING LITHOTOMY PAIR STRL (DRAPES) ×2 IMPLANT
OCCLUDER COLPOPNEUMO (BALLOONS) IMPLANT
PACK LAVH (CUSTOM PROCEDURE TRAY) ×2 IMPLANT
PAD PREP 24X48 CUFFED NSTRL (MISCELLANEOUS) ×4 IMPLANT
PLUG CATH AND CAP STER (CATHETERS) ×2 IMPLANT
PROTECTOR NERVE ULNAR (MISCELLANEOUS) ×4 IMPLANT
SET CYSTO W/LG BORE CLAMP LF (SET/KITS/TRAYS/PACK) ×4 IMPLANT
SET IRRIG TUBING LAPAROSCOPIC (IRRIGATION / IRRIGATOR) ×2 IMPLANT
SOLUTION ELECTROLUBE (MISCELLANEOUS) ×2 IMPLANT
SUT PLAIN 2 0 (SUTURE) ×1
SUT PLAIN 3 0 FS 2 27 (SUTURE) ×2 IMPLANT
SUT PLAIN ABS 2-0 CT1 27XMFL (SUTURE) ×1 IMPLANT
SUT VIC AB 0 CT1 27 (SUTURE) ×2
SUT VIC AB 0 CT1 27XBRD ANBCTR (SUTURE) ×2 IMPLANT
SUT VICRYL 0 27 CT2 27 ABS (SUTURE) ×12 IMPLANT
SUT VICRYL 0 UR6 27IN ABS (SUTURE) ×2 IMPLANT
SUT VICRYL RAPIDE 4/0 PS 2 (SUTURE) ×4 IMPLANT
SYR 50ML LL SCALE MARK (SYRINGE) ×4 IMPLANT
TIP RUMI ORANGE 6.7MMX12CM (TIP) IMPLANT
TIP UTERINE 5.1X6CM LAV DISP (MISCELLANEOUS) IMPLANT
TIP UTERINE 6.7X10CM GRN DISP (MISCELLANEOUS) IMPLANT
TIP UTERINE 6.7X6CM WHT DISP (MISCELLANEOUS) IMPLANT
TIP UTERINE 6.7X8CM BLUE DISP (MISCELLANEOUS) ×2 IMPLANT
TOWEL OR 17X24 6PK STRL BLUE (TOWEL DISPOSABLE) ×6 IMPLANT
TROCAR 12M 150ML BLUNT (TROCAR) ×2 IMPLANT
TROCAR DISP BLADELESS 8 DVNC (TROCAR) ×1 IMPLANT
TROCAR DISP BLADELESS 8MM (TROCAR) ×1
TROCAR XCEL NON-BLD 11X100MML (ENDOMECHANICALS) ×2 IMPLANT
TUBING FILTER THERMOFLATOR (ELECTROSURGICAL) ×2 IMPLANT
WARMER LAPAROSCOPE (MISCELLANEOUS) ×2 IMPLANT
WATER STERILE IRR 1000ML POUR (IV SOLUTION) ×6 IMPLANT

## 2012-05-28 NOTE — Op Note (Signed)
05/28/2012  1:21 PM  PATIENT:  Kaitlyn Reid  42 y.o. female  PRE-OPERATIVE DIAGNOSIS:  Fibroids CPT 58550  POST-OPERATIVE DIAGNOSIS:  Fibroids CPT 58550  PROCEDURE:  Robotic assisted laparoscopic hysterectomy with bilateral salpingectomy  SURGEON:  Surgeon(s) and Role:    * Naraya Stoneberg J. Richardson Dopp, MD - Primary    * Geryl Rankins, MD - Assisting  PHYSICIAN ASSISTANT:   ASSISTANTS: none   ANESTHESIA:   general  EBL:  Total I/O In: 2200 [I.V.:2200] Out: 530 [Urine:330; Blood:200]  BLOOD ADMINISTERED:none  DRAINS: none   LOCAL MEDICATIONS USED:  OTHER ropivicaine   SPECIMEN:  Source of Specimen:  uterus cervix bilateral fallopian tubes   DISPOSITION OF SPECIMEN:  PATHOLOGY  COUNTS:  YES  TOURNIQUET:  * No tourniquets in log *  DICTATION: .Dragon Dictation  PLAN OF CARE: Admit for overnight observation  PATIENT DISPOSITION:  PACU - hemodynamically stable.   Delay start of Pharmacological VTE agent (>24hrs) due to surgical blood loss or risk of bleeding: not applicable  Findings. Multiple uterine fibroids... Large posterior lower uterine segment fibroid .Marland Kitchen Normal appearing fallopian tubes and ovaries.    Procedure: The patient was taken to the operating room where she was placed under general anesthesia.Time out was performed. Marland Kitchen She was placed in dorsal lithotomy position and prepped and draped in the usual sterile fashion. A weighted speculum was placed into the vagina. A Deaver was placed anteriorly for retraction. The anterior lip of the cervix was grasped with a single-tooth tenaculum. The vaginal mucosa was injected with 2.5 cc of ropivacaine at the 2/4/ 8 and 10 o'clock positions. The uterus was sounded to 9 cm the cervix was dilated to 6 mm . 0 vicryl suture placed at the 12 and 6:00 positions Of the cervix to facilitate placement of a Ru mi uterine manipulator. The manipulator was placed without difficulty. Weighted speculum and Deaver were removed .  Attention was turned  to the patient's abdomen where a 12 mm trocar was placed 4 cm above the umbilicus. under direct visualization . The pneumoperitoneum was achieved with PCO2 gas. The laparoscope was removed. 60 cc of ropivacaine were injected into the abdominal cavity. The laparoscope was reinserted. An 8 mm trocar was placed in the right upper quadrant 12 centimeters from the umbilicus.later connected to robotic arm #1.  An 8 mm incision was made in the left upper quadrant 16 cm from the umbilicus and connected to robot arm #2. Marland Kitchen Attention was turned to the left upper quadrant where a 11 mm midclavicular assistant trocar was placed. ( All incision sites were injected with 10cc of ropivacaine prior to port placement. )  Once all ports had been placed under direct visualization.The laparoscope was removed and the da Vinci robotic system was thin right-sided docked. The robotic arms were connected to the corresponding trocars as listed above. The laparoscope was then reinserted. The PK bipolar cautery was placed into port #2. The monopolar scissor placed in the port #1. All instruments were directed into the pelvis under direct visualization.  Attention was turned to the surgeons console.. The left mesosalpinx and left utero-ovarian ligament was cauterized with PK and excised with scissors. The broad ligament was cauterized with PK incised with scissors. The round ligament was cauterized with the PK incised with scissors. The anterior leaf of broad ligament was incised along the bladder reflection to the midline.  The right mesosalpinx and right utero-ovarian ligament was cauterized with PK and excised with scissors. The right broad ligament was  cauterized with PK excised scissors. The right round ligament was cauterized with PK and excised with scissors. The broad ligament was incised to the midline. A posterior fibroid hindered visualization. The bladder was dissected off the lower uterine segments of the cervix via sharp and  blunt dissection. The bladder was filled in a retrograde fashion with methylene blue and sterile water to help with dissection Of the bladder from the lower uterine segment.  The uterine arteries were skeleton bilaterally. They were cauterized with PK and transected. The KOH ring was identified. The anterior colpotomy was performed. The posterior fibroid  Hindered visualization of the posterior aspect of the koi ring.  Once the uterus,cervix and bilateral fallopian tubes were completely excised They were removed through the vagina.  The pk and scissors were removed and log tip forceps were placed in the port #1 and the cutting needle driver was placed in to port #2. The vaginal cuff angles were closed with figure-of-eight stitches of 0 Vicryl. The remainder of the vaginal cuff was closed with interrupted 0 Vicryl figure-of-eight sutures. The pelvis was irrigated.   Marland Kitchen.Excellent hemostasis was noted. All pelvic pedicles were examined and hemostasis was noted.  Inter seed was placed along the vaginal cuff. All instruments removed from the ports. All ports were removed under direct Visualization. The pneumoperitoneum was released. The fascia of the 12 mm umbilical port was closed with 0 Vicryl and the fascia the 11 mm port was closed with 0 Vicryl. The skin incisions were closed with 4-0 Vicryl and then covered with Derma bond.  Indigo Carmine was given and cystoscopy was performed using a 30 Degree scope. Both ureteral orifices were identified and found to express indigo carmine.  Sponge lap and needle counts were correct x. The patient was awakened from anesthesia and taken to the recovery room in stable condition.

## 2012-05-28 NOTE — H&P (Signed)
Date of Initial H&P: 05/20/2012 History reviewed, patient examined, no change in status, stable for surgery.

## 2012-05-28 NOTE — Anesthesia Procedure Notes (Signed)
Procedure Name: Intubation Date/Time: 05/28/2012 8:04 AM Performed by: Shanon Payor Pre-anesthesia Checklist: Suction available, Emergency Drugs available, Timeout performed, Patient identified and Patient being monitored Patient Re-evaluated:Patient Re-evaluated prior to inductionOxygen Delivery Method: Circle system utilized Preoxygenation: Pre-oxygenation with 100% oxygen Intubation Type: IV induction Ventilation: Mask ventilation without difficulty Grade View: Grade I Tube type: Oral Tube size: 7.0 mm Number of attempts: 1 Airway Equipment and Method: Stylet and Video-laryngoscopy Placement Confirmation: ETT inserted through vocal cords under direct vision,  positive ETCO2 and breath sounds checked- equal and bilateral Secured at: 22 cm Tube secured with: Tape Dental Injury: Teeth and Oropharynx as per pre-operative assessment  Difficulty Due To: Difficulty was anticipated and Difficult Airway-  due to edematous airway

## 2012-05-28 NOTE — Progress Notes (Signed)
UR completed 

## 2012-05-28 NOTE — Anesthesia Postprocedure Evaluation (Signed)
  Anesthesia Post-op Note  Patient: Kaitlyn Reid  Procedure(s) Performed: Procedure(s): ROBOTIC ASSISTED TOTAL HYSTERECTOMY (N/A) CYSTOSCOPY (N/A)  Patient Location: Mother/Baby  Anesthesia Type:General  Level of Consciousness: awake, alert  and oriented  Airway and Oxygen Therapy: Patient Spontanous Breathing and Patient connected to nasal cannula oxygen  Post-op Pain: none  Post-op Assessment: Post-op Vital signs reviewed and Patient's Cardiovascular Status Stable  Post-op Vital Signs: Reviewed and stable  Complications: No apparent anesthesia complications

## 2012-05-28 NOTE — Progress Notes (Signed)
Pt planning on going to sleep soon... Tried to position carbon dioxide monitor under CPAP machine... Could not make it fit because CPAP machine fit directly into nostrils... Spoke with Maralyn Sago in Respiratory who stated it was okay if pt did not wear carbon dioxide monitor as long as the pt had continuous pulse ox monitoring that could be monitored out at nurses station... Will continue to monitor the pt.

## 2012-05-28 NOTE — Transfer of Care (Signed)
Immediate Anesthesia Transfer of Care Note  Patient: Kaitlyn Reid  Procedure(s) Performed: Procedure(s): ROBOTIC ASSISTED TOTAL HYSTERECTOMY (N/A) CYSTOSCOPY (N/A)  Patient Location: PACU  Anesthesia Type:General  Level of Consciousness: awake, alert  and oriented  Airway & Oxygen Therapy: Patient Spontanous Breathing and Patient connected to nasal cannula oxygen  Post-op Assessment: Report given to PACU RN and Post -op Vital signs reviewed and stable  Post vital signs: Reviewed and stable  Complications: No apparent anesthesia complications

## 2012-05-28 NOTE — Anesthesia Postprocedure Evaluation (Signed)
  Anesthesia Post-op Note  Anesthesia Post Note  Patient: Kaitlyn Reid  Procedure(s) Performed: Procedure(s) (LRB): ROBOTIC ASSISTED TOTAL HYSTERECTOMY (N/A) CYSTOSCOPY (N/A)  Anesthesia type: General  Patient location: PACU  Post pain: Pain level controlled  Post assessment: Post-op Vital signs reviewed  Last Vitals:  Filed Vitals:   05/28/12 1500  BP: 129/64  Pulse: 92  Temp: 36.9 C  Resp: 20    Post vital signs: Reviewed  Level of consciousness: sedated  Complications: No apparent anesthesia complications

## 2012-05-28 NOTE — Anesthesia Preprocedure Evaluation (Addendum)
Anesthesia Evaluation  Patient identified by MRN, date of birth, ID band Patient awake    Reviewed: Allergy & Precautions, H&P , NPO status , Patient's Chart, lab work & pertinent test results, reviewed documented beta blocker date and time   Airway Mallampati: I TM Distance: >3 FB Neck ROM: full    Dental no notable dental hx. (+) Teeth Intact   Pulmonary sleep apnea and Continuous Positive Airway Pressure Ventilation ,  breath sounds clear to auscultation  Pulmonary exam normal       Cardiovascular negative cardio ROS  Rhythm:regular Rate:Normal     Neuro/Psych negative neurological ROS     GI/Hepatic negative GI ROS, Neg liver ROS,   Endo/Other  negative endocrine ROSMorbid obesity  Renal/GU negative Renal ROS  negative genitourinary   Musculoskeletal negative musculoskeletal ROS (+)   Abdominal   Peds negative pediatric ROS (+)  Hematology negative hematology ROS (+)   Anesthesia Other Findings   Reproductive/Obstetrics negative OB ROS                          Anesthesia Physical Anesthesia Plan  ASA: III  Anesthesia Plan: General   Post-op Pain Management:    Induction: Intravenous  Airway Management Planned: Video Laryngoscope Planned  Additional Equipment:   Intra-op Plan:   Post-operative Plan:   Informed Consent: I have reviewed the patients History and Physical, chart, labs and discussed the procedure including the risks, benefits and alternatives for the proposed anesthesia with the patient or authorized representative who has indicated his/her understanding and acceptance.   Dental Advisory Given and History available from chart only  Plan Discussed with: CRNA  Anesthesia Plan Comments: (  Discussed  general anesthesia, including possible nausea, instrumentation of airway, sore throat,pulmonary aspiration, etc. I asked if the were any outstanding questions, or   concerns before we proceeded. )       Anesthesia Quick Evaluation

## 2012-05-29 LAB — CBC
HCT: 31.7 % — ABNORMAL LOW (ref 36.0–46.0)
Hemoglobin: 10.5 g/dL — ABNORMAL LOW (ref 12.0–15.0)
MCH: 28 pg (ref 26.0–34.0)
MCHC: 33.1 g/dL (ref 30.0–36.0)
MCV: 84.5 fL (ref 78.0–100.0)
Platelets: 209 10*3/uL (ref 150–400)
RBC: 3.75 MIL/uL — ABNORMAL LOW (ref 3.87–5.11)
RDW: 13.8 % (ref 11.5–15.5)
WBC: 8.2 10*3/uL (ref 4.0–10.5)

## 2012-05-29 MED ORDER — IBUPROFEN 800 MG PO TABS: 800.0000 mg | ORAL_TABLET | Freq: Three times a day (TID) | ORAL | Status: DC | PRN

## 2012-05-29 MED ORDER — OXYCODONE-ACETAMINOPHEN 5-325 MG PO TABS: 1.0000 | ORAL_TABLET | Freq: Four times a day (QID) | ORAL | Status: DC | PRN

## 2012-05-29 NOTE — Discharge Summary (Signed)
Physician Discharge Summary  Patient ID: Kaitlyn Reid MRN: [id number] DOB/AGE: Jun 19, 1970 42 y.o.  Admit date: 05/28/2012 Discharge date: 05/29/2012  Admission Diagnoses: uterine fibroids/ abnormal uterine bleeding/ pelvic pain   Discharge Diagnoses: Same Plus s/p robotic assisted laparoscopic hysterectomy with bilateral salpingectomy  Active Problems:   * No active hospital problems. *   Discharged Condition: stable  Hospital Course:  Pt was admitted for overnight observation after robotic hysterectomy. She did well over night with return of bladder function. Tolerating po,.  Ambulating without difficulty   Consults: None  Significant Diagnostic Studies: labs: Hgb 10  Treatments: surgery: robotic assisted laparoscopic hysterectomy with bilateral salpingectomy   Discharge Exam: Blood pressure 118/73, pulse 78, temperature 98.7 F (37.1 C), temperature source Oral, resp. rate 20, height 5\' 9"  (1.753 m), weight 120.203 kg (265 lb), SpO2 98.00%. General appearance: alert and cooperative GI: soft appropriately tender nondistended + BS  Incision/Wound:well approximated   Disposition: 01-Home or Self Care  Discharge Orders   Future Orders Complete By Expires     Call MD for:  persistant nausea and vomiting  As directed     Call MD for:  redness, tenderness, or signs of infection (pain, swelling, redness, odor or green/yellow discharge around incision site)  As directed     Call MD for:  severe uncontrolled pain  As directed     Call MD for:  temperature >100.4  As directed     Diet general  As directed     Driving Restrictions  As directed     Comments:      Avoid driving for 1 week    Increase activity slowly  As directed     Lifting restrictions  As directed     Comments:      Do not lift over 10 lbs    Sexual Activity Restrictions  As directed     Comments:      Avoid sex        Medication List    STOP taking these medications       HYDROcodone-acetaminophen 5-325  MG per tablet  Commonly known as:  NORCO/VICODIN      TAKE these medications       ALPRAZolam 0.25 MG tablet  Commonly known as:  XANAX  Take 0.5 mg by mouth once.     FLUoxetine 40 MG capsule  Commonly known as:  PROZAC  Take 40 mg by mouth daily.     ibuprofen 800 MG tablet  Commonly known as:  MOTRIN IB  Take 1 tablet (800 mg total) by mouth every 8 (eight) hours as needed for pain.     multivitamin with minerals Tabs  Take 1 tablet by mouth daily.     oxyCODONE-acetaminophen 5-325 MG per tablet  Commonly known as:  PERCOCET/ROXICET  Take 1-2 tablets by mouth every 6 (six) hours as needed.     VITAMIN D PO  Take 1 capsule by mouth daily. 1200mg  daily           Follow-up Information   Follow up with Jessee Avers., MD. Schedule an appointment as soon as possible for a visit in 2 weeks.   Contact information:   [address] [place] [address] [phone]       Signed: Jessee Avers. 05/29/2012, 8:22 AM

## 2012-05-29 NOTE — Progress Notes (Signed)
Teaching complete  Pt ambulated out with significant  other

## 2012-05-30 ENCOUNTER — Encounter (HOSPITAL_COMMUNITY): Payer: Self-pay | Admitting: Obstetrics and Gynecology

## 2014-01-12 ENCOUNTER — Encounter (HOSPITAL_COMMUNITY): Payer: Self-pay | Admitting: Obstetrics and Gynecology

## 2017-02-12 ENCOUNTER — Other Ambulatory Visit: Payer: Self-pay | Admitting: Sports Medicine

## 2017-02-12 DIAGNOSIS — M542 Cervicalgia: Secondary | ICD-10-CM

## 2017-02-18 ENCOUNTER — Other Ambulatory Visit: Payer: Self-pay

## 2017-02-26 ENCOUNTER — Ambulatory Visit
Admission: RE | Admit: 2017-02-26 | Discharge: 2017-02-26 | Disposition: A | Discharge status: Home / Self Care | Payer: BLUE CROSS/BLUE SHIELD | Source: Ambulatory Visit | Attending: Sports Medicine | Admitting: Sports Medicine

## 2017-02-26 DIAGNOSIS — M542 Cervicalgia: Secondary | ICD-10-CM

## 2017-09-19 ENCOUNTER — Emergency Department (HOSPITAL_COMMUNITY)
Admission: EM | Admit: 2017-09-19 | Discharge: 2017-09-20 | Disposition: A | Discharge status: Home / Self Care | Payer: BLUE CROSS/BLUE SHIELD | Attending: Emergency Medicine | Admitting: Emergency Medicine

## 2017-09-19 ENCOUNTER — Other Ambulatory Visit: Payer: Self-pay

## 2017-09-19 ENCOUNTER — Encounter (HOSPITAL_COMMUNITY): Payer: Self-pay

## 2017-09-19 DIAGNOSIS — Z79899 Other long term (current) drug therapy: Secondary | ICD-10-CM | POA: Insufficient documentation

## 2017-09-19 DIAGNOSIS — M545 Low back pain, unspecified: Secondary | ICD-10-CM

## 2017-09-19 MED ORDER — OXYCODONE-ACETAMINOPHEN 5-325 MG PO TABS
1.0000 | ORAL_TABLET | ORAL | Status: DC | PRN
  Administered 2017-09-19: 1 via ORAL
  Filled 2017-09-19: qty 1

## 2017-09-19 NOTE — ED Triage Notes (Signed)
Pt states that she twisted her back while at work yesterday, denies heavy lifting, pain in lower back, unrelieved by ibuprofen.

## 2017-09-19 NOTE — ED Notes (Signed)
Patient ambulated to restroom.  Gait steady and even

## 2017-09-20 ENCOUNTER — Emergency Department (HOSPITAL_COMMUNITY): Payer: BLUE CROSS/BLUE SHIELD

## 2017-09-20 MED ORDER — OXYCODONE-ACETAMINOPHEN 5-325 MG PO TABS
1.0000 | ORAL_TABLET | Freq: Once | ORAL | Status: AC
  Administered 2017-09-20: 1 via ORAL
  Filled 2017-09-20: qty 1

## 2017-09-20 MED ORDER — METHOCARBAMOL 500 MG PO TABS
500.0000 mg | ORAL_TABLET | Freq: Two times a day (BID) | ORAL | 0 refills | Status: DC
Start: 2017-09-20 — End: 2020-07-05

## 2017-09-20 MED ORDER — ACETAMINOPHEN 325 MG PO TABS: 325.0000 mg | ORAL_TABLET | Freq: Four times a day (QID) | ORAL | 0 refills | Status: DC | PRN

## 2017-09-20 NOTE — ED Provider Notes (Addendum)
[facility] EMERGENCY DEPARTMENT Provider Note   CSN: [id number] Arrival date & time: 09/19/17  2035     History   Chief Complaint Chief Complaint  Patient presents with  . Back Pain    HPI Kaitlyn Reid is a 47 y.o. female patient presenting for lower back pain that began yesterday while she is at work.  Patient states that she was sitting in her office chair when she turned and felt her lower back spasm.  Patient states that she took an ibuprofen and this relieved some of her pain.  Patient states that when she woke up this morning her pain had increased significantly.  Describes her pain as a 10/10 spasm-like pain of her bilateral lower back that is worsened with movement.   Patient is tearful in room, she states that her pain has increased since her first dose of pain medication in department.  Patient states that upon arrival to the emergency department she did feel nauseous due to pain however she states that that has now passed and she is not feeling nauseous at this time. Patient denies history of trauma, fever, vomiting, urinary incontinence or fecal incontinence, numbness, tingling, weakness. HPI  Past Medical History:  Diagnosis Date  . Anemia   . Anxiety   . Complication of anesthesia 1980s   woke up with headaches postop  . Depression   . Fibroids   . Headache(784.0)    migraines  . Sleep apnea    uses a CPAP    There are no active problems to display for this patient.   Past Surgical History:  Procedure Laterality Date  . ablation  2011   uterine  . ANKLE RECONSTRUCTION  2009   R  . CYSTOSCOPY N/A 05/28/2012   Procedure: CYSTOSCOPY;  Surgeon: Maeola Sarah. Landry Mellow, MD;  Location: [facility];  Service: Gynecology;  Laterality: N/A;  . DILATION AND CURETTAGE OF UTERUS    . LIPOMA EXCISION    . ROBOTIC ASSISTED TOTAL HYSTERECTOMY N/A 05/28/2012   Procedure: ROBOTIC ASSISTED TOTAL HYSTERECTOMY;  Surgeon: Maeola Sarah. Landry Mellow, MD;  Location: [facility];  Service:  Gynecology;  Laterality: N/A;     OB History    Gravida  2   Para  1   Term  1   Preterm  0   AB  1   Living  1     SAB  1   TAB  0   Ectopic  0   Multiple  0   Live Births               Home Medications    Prior to Admission medications   Medication Sig Start Date End Date Taking? Authorizing Provider  acetaminophen (TYLENOL) 325 MG tablet Take 1 tablet (325 mg total) by mouth every 6 (six) hours as needed. 09/20/17   Nuala Alpha A, PA-C  ALPRAZolam Duanne Moron) 0.25 MG tablet Take 0.5 mg by mouth once.    [provider]  Cholecalciferol (VITAMIN D PO) Take 1 capsule by mouth daily. 1200mg  daily    [provider]  FLUoxetine (PROZAC) 40 MG capsule Take 40 mg by mouth daily.     [provider]  ibuprofen (MOTRIN IB) 800 MG tablet Take 1 tablet (800 mg total) by mouth every 8 (eight) hours as needed for pain. 05/29/12   Christophe Louis, MD  methocarbamol (ROBAXIN) 500 MG tablet Take 1 tablet (500 mg total) by mouth 2 (two) times daily. 09/20/17   Athena Masse,  Orvan Papadakis A, PA-C  Multiple Vitamin (MULTIVITAMIN WITH MINERALS) TABS Take 1 tablet by mouth daily.    [provider]  oxyCODONE-acetaminophen (PERCOCET/ROXICET) 5-325 MG per tablet Take 1-2 tablets by mouth every 6 (six) hours as needed. 05/29/12   Christophe Louis, MD    Family History No family history on file.  Social History Social History   Tobacco Use  . Smoking status: Never Smoker  . Smokeless tobacco: Never Used  Substance Use Topics  . Alcohol use: Yes    Alcohol/week: 3.6 oz    Types: 6 Glasses of wine per week    Comment: occasionally  . Drug use: No     Allergies   Patient has no known allergies.   Review of Systems Review of Systems  Constitutional: Negative.  Negative for fever.  HENT: Negative.  Negative for congestion, rhinorrhea and sore throat.   Eyes: Negative.  Negative for visual disturbance.  Respiratory: Negative.  Negative for choking and  shortness of breath.   Cardiovascular: Negative.  Negative for chest pain and leg swelling.  Gastrointestinal: Negative.  Negative for abdominal pain, diarrhea, nausea and vomiting.  Genitourinary: Negative.  Negative for difficulty urinating and pelvic pain.  Musculoskeletal: Positive for back pain. Negative for neck pain and neck stiffness.  Skin: Negative.  Negative for rash and wound.  Neurological: Negative.  Negative for dizziness, syncope, weakness, numbness and headaches.     Physical Exam Updated Vital Signs BP 134/89 (BP Location: Left Arm)   Pulse (!) 56   Temp 97.9 F (36.6 C) (Oral)   Resp 16   LMP 12/22/2011   SpO2 96%   Physical Exam  Constitutional: She is oriented to person, place, and time. She appears well-developed and well-nourished.  Patient tearful  HENT:  Head: Normocephalic and atraumatic.  Eyes: Pupils are equal, round, and reactive to light. EOM are normal.  Neck: Normal range of motion. Neck supple. No JVD present. No tracheal deviation present.  Cardiovascular: Normal rate and regular rhythm.  Pulmonary/Chest: Effort normal and breath sounds normal. No respiratory distress.  Abdominal: Soft. There is no tenderness. There is no rebound and no guarding.  Musculoskeletal:       Cervical back: Normal.       Thoracic back: Normal.       Lumbar back: She exhibits tenderness, bony tenderness and spasm. She exhibits no swelling, no edema and no deformity.       Back:  Patient with midline lumbar tenderness to palpation.  Patient with bilateral lower back tenderness to palpation, tense muscles noted bilaterally. No lumbar spine deformity, crepitus or step-off noted. Positive straight leg test bilaterally, increased low back pain with extension of the legs.  No midline cervical or thoracic spine TTP, no paraspinal muscle tenderness, no deformity, crepitus, or step-off noted. Hips are stable and without tenderness.  Neurological: She is alert and oriented to  person, place, and time. She has normal strength. No cranial nerve deficit or sensory deficit.  Patient neurovascularly intact to all extremities bilaterally.  Skin: Skin is warm and dry.  Psychiatric: She has a normal mood and affect. Her behavior is normal.     ED Treatments / Results  Labs (all labs ordered are listed, but only abnormal results are displayed) Labs Reviewed - No data to display  EKG None  Radiology Dg Lumbar Spine Complete  Result Date: 09/20/2017 CLINICAL DATA:  Twisted back at work with lower back pain EXAM: LUMBAR SPINE - COMPLETE 4+ VIEW COMPARISON:  None. FINDINGS: Five lumbar type vertebral bodies. Mild lumbar levocurvature. Slight L4-5 anterolisthesis. No focal or notable disc degeneration. No evidence of fracture or bone lesion. IMPRESSION: 1. No acute finding. 2. Levocurvature and mild L4-5 anterolisthesis. Electronically Signed   By: Monte Fantasia M.D.   On: 09/20/2017 01:51    Procedures Procedures (including critical care time)  Medications Ordered in ED Medications  oxyCODONE-acetaminophen (PERCOCET/ROXICET) 5-325 MG per tablet 1 tablet (1 tablet Oral Given 09/19/17 2136)  oxyCODONE-acetaminophen (PERCOCET/ROXICET) 5-325 MG per tablet 1 tablet (1 tablet Oral Given 09/20/17 0115)     Initial Impression / Assessment and Plan / ED Course  I have reviewed the triage vital signs and the nursing notes.  Pertinent labs & imaging results that were available during my care of the patient were reviewed by me and considered in my medical decision making (see chart for details).   Lumbar radiographs ordered due to concern of midline lumbar spinal tenderness.  Radiographs negative for acute findings.  Patient with back pain.  No neurological deficits and normal neuro exam.  Patient can walk but states is painful.  No loss of bowel or bladder control.  No concern for cauda equina.  No fever, night sweats, weight loss, h/o cancer, IVDU.  Patient's case discussed  with Dr. Betsey Holiday who reviewed the patient's chart, he agrees with plan to discharge primary care follow-up as well as muscle relaxers and anti-inflammatory medications.  Patient states understanding of care plan and is agreeable to discharge.  Patient states that her pain has improved and is ambulating in department.  At this time there does not appear to be any evidence of an acute emergency medical condition and the patient appears stable for discharge with appropriate outpatient follow up. Diagnosis was discussed with patient who verbalizes understanding and is agreeable to discharge. I have discussed return precautions with patient and family who verbalize understanding of return precautions. Patient strongly encouraged to keep their appointment at the primary care provider tomorrow morning.   Patient's case discussed with Dr. Betsey Holiday who agrees with plan to discharge with follow-up.     This note was dictated using DragonOne dictation software; please contact for any inconsistencies within the note.  Final Clinical Impressions(s) / ED Diagnoses   Final diagnoses:  Acute bilateral low back pain without sciatica    ED Discharge Orders        Ordered    acetaminophen (TYLENOL) 325 MG tablet  Every 6 hours PRN     09/20/17 0229    methocarbamol (ROBAXIN) 500 MG tablet  2 times daily     09/20/17 0229       Deliah Boston, PA-C 09/20/17 0243    Deliah Boston, PA-C 09/20/17 0244    Orpah Greek, MD 09/20/17 [phone]

## 2017-09-20 NOTE — ED Notes (Signed)
Provider notified of patients request for additional pain medication 

## 2017-09-20 NOTE — ED Notes (Signed)
Pt given a sandwich bag and something to drink. Cleared by provider

## 2017-09-20 NOTE — Discharge Instructions (Addendum)
Please follow-up with your primary care provider regarding your visit today. You may use anti-inflammatories such as Tylenol as needed for pain. You may use Robaxin as prescribed for pain, do not drive or operate heavy machinery while taking Robaxin. Please return to the emergency department for any new or worsening symptoms.  Contact a health care provider if: You have pain that is not relieved with rest or medicine. You have increasing pain going down into your legs or buttocks. Your pain does not improve in 2 weeks. You have pain at night. You lose weight. You have a fever or chills. Get help right away if: You develop new bowel or bladder control problems. You have unusual weakness or numbness in your arms or legs. You develop nausea or vomiting. You develop abdominal pain. You feel faint.

## 2018-08-20 ENCOUNTER — Other Ambulatory Visit: Payer: Self-pay | Admitting: Dermatology

## 2018-08-20 DIAGNOSIS — C4491 Basal cell carcinoma of skin, unspecified: Secondary | ICD-10-CM

## 2018-08-20 HISTORY — DX: Basal cell carcinoma of skin, unspecified: C44.91

## 2018-09-30 ENCOUNTER — Encounter: Payer: Self-pay | Admitting: *Deleted

## 2020-02-11 ENCOUNTER — Encounter: Payer: Self-pay | Admitting: Obstetrics and Gynecology

## 2020-03-17 IMAGING — DX DG LUMBAR SPINE COMPLETE 4+V
5 series · 5 of 5 positions shown · non-contrast
Comparison: None.

CLINICAL DATA: Twisted back at work with lower back pain

EXAM:
LUMBAR SPINE - COMPLETE 4+ VIEW

[l-spine ap]
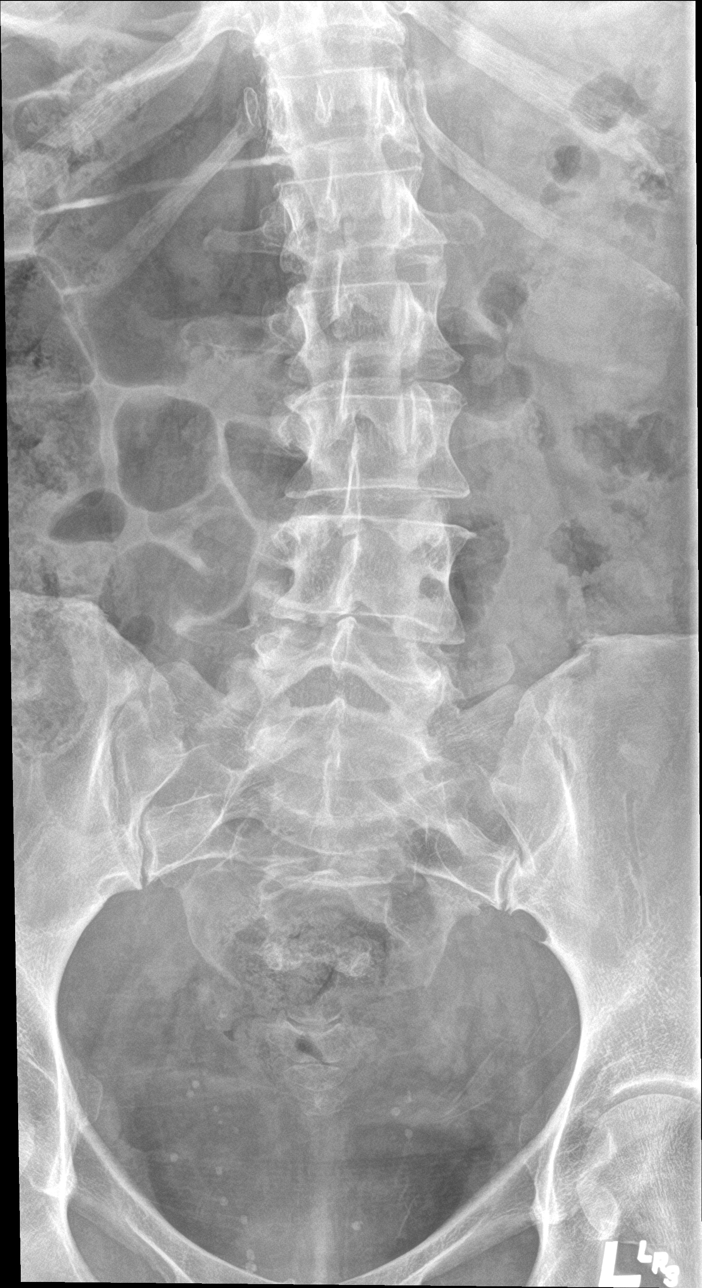

[l-spine obl (1 of 2)]
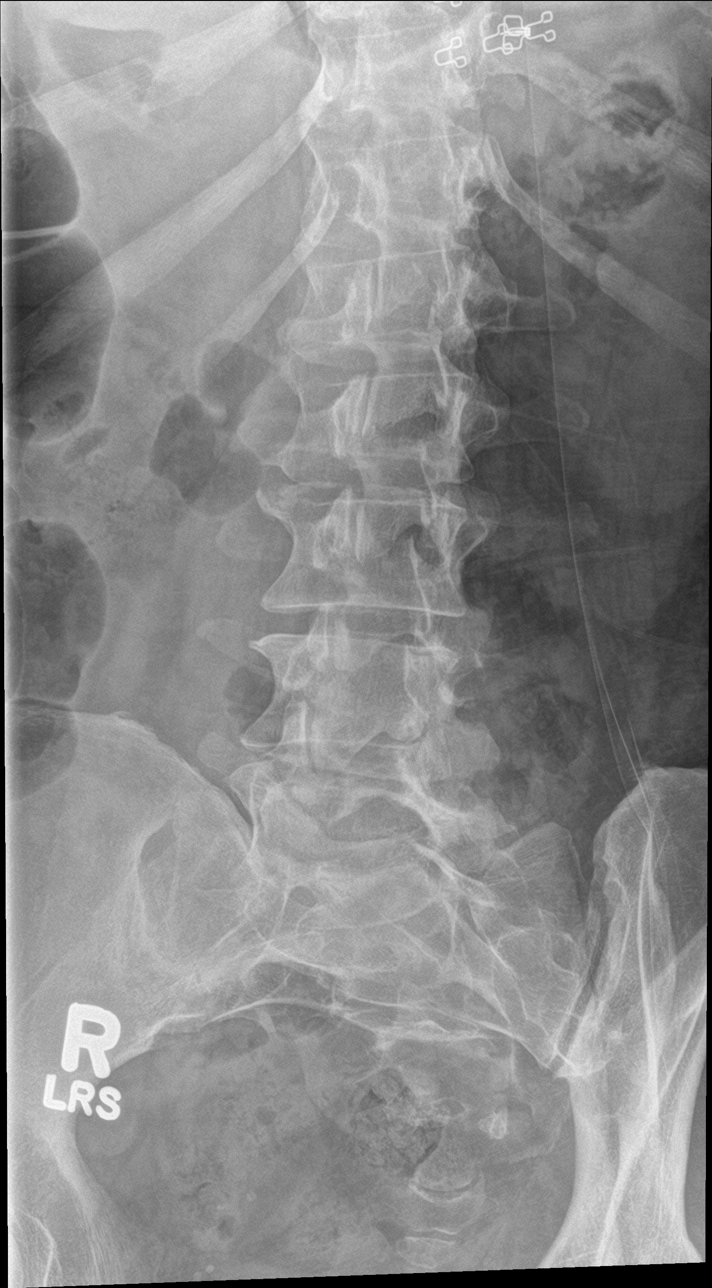

[l-spine obl (2 of 2)]
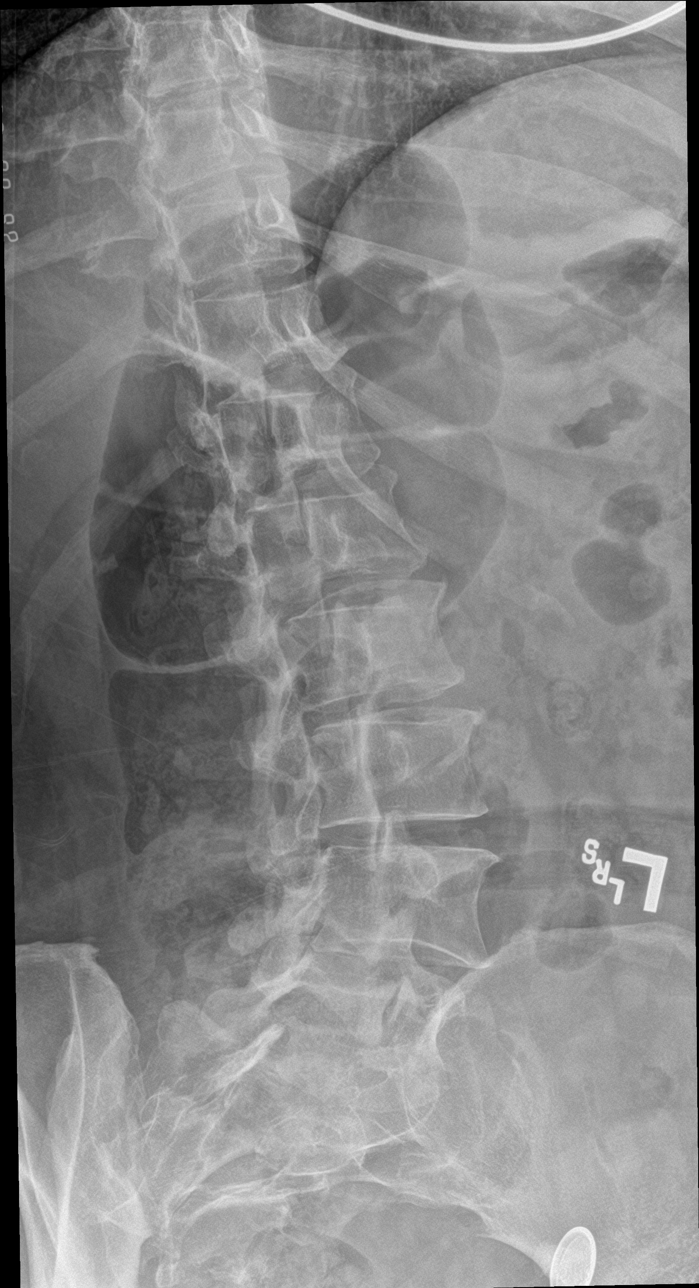

[l-spine lat]
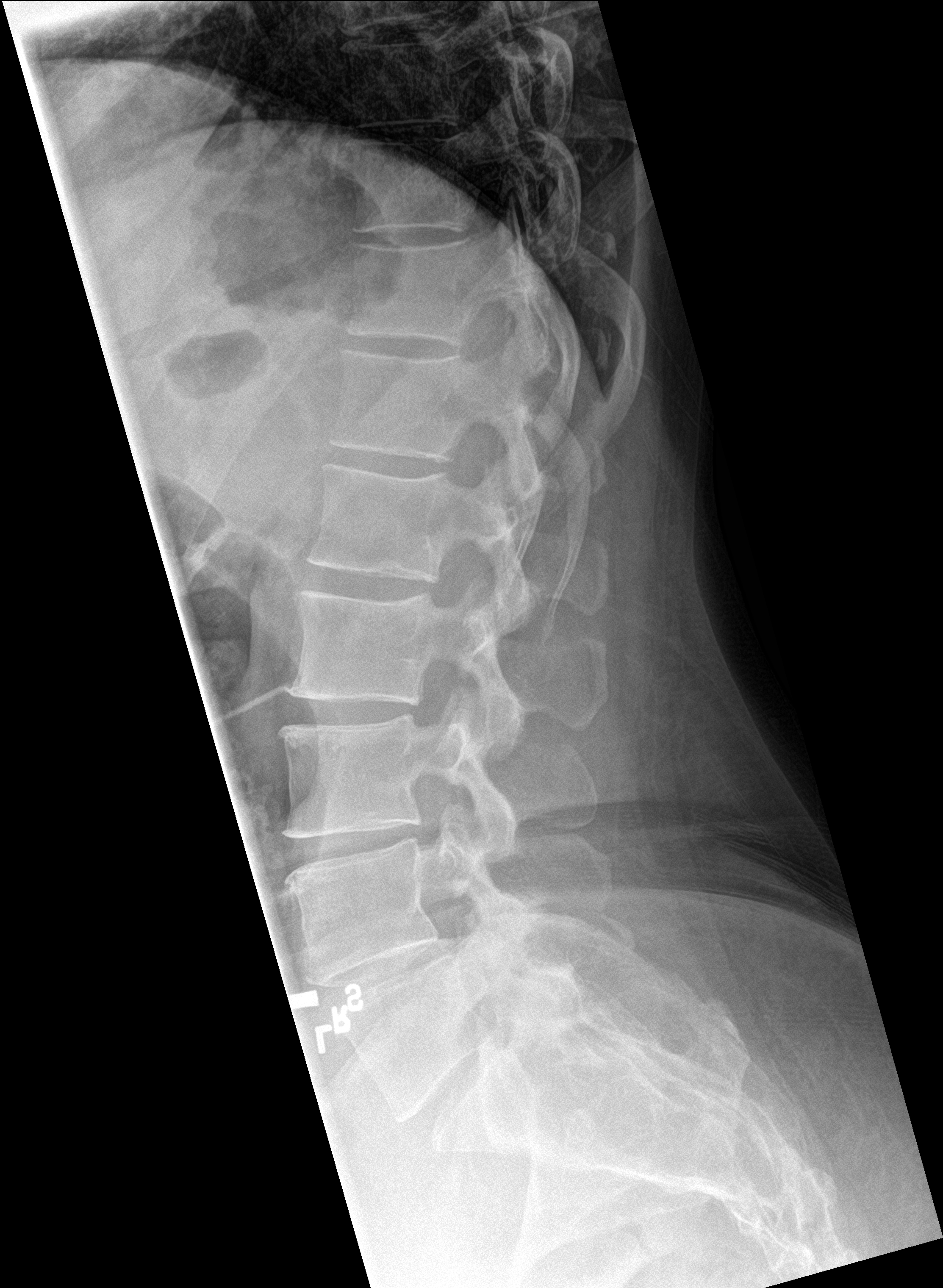

[l-spine spot]
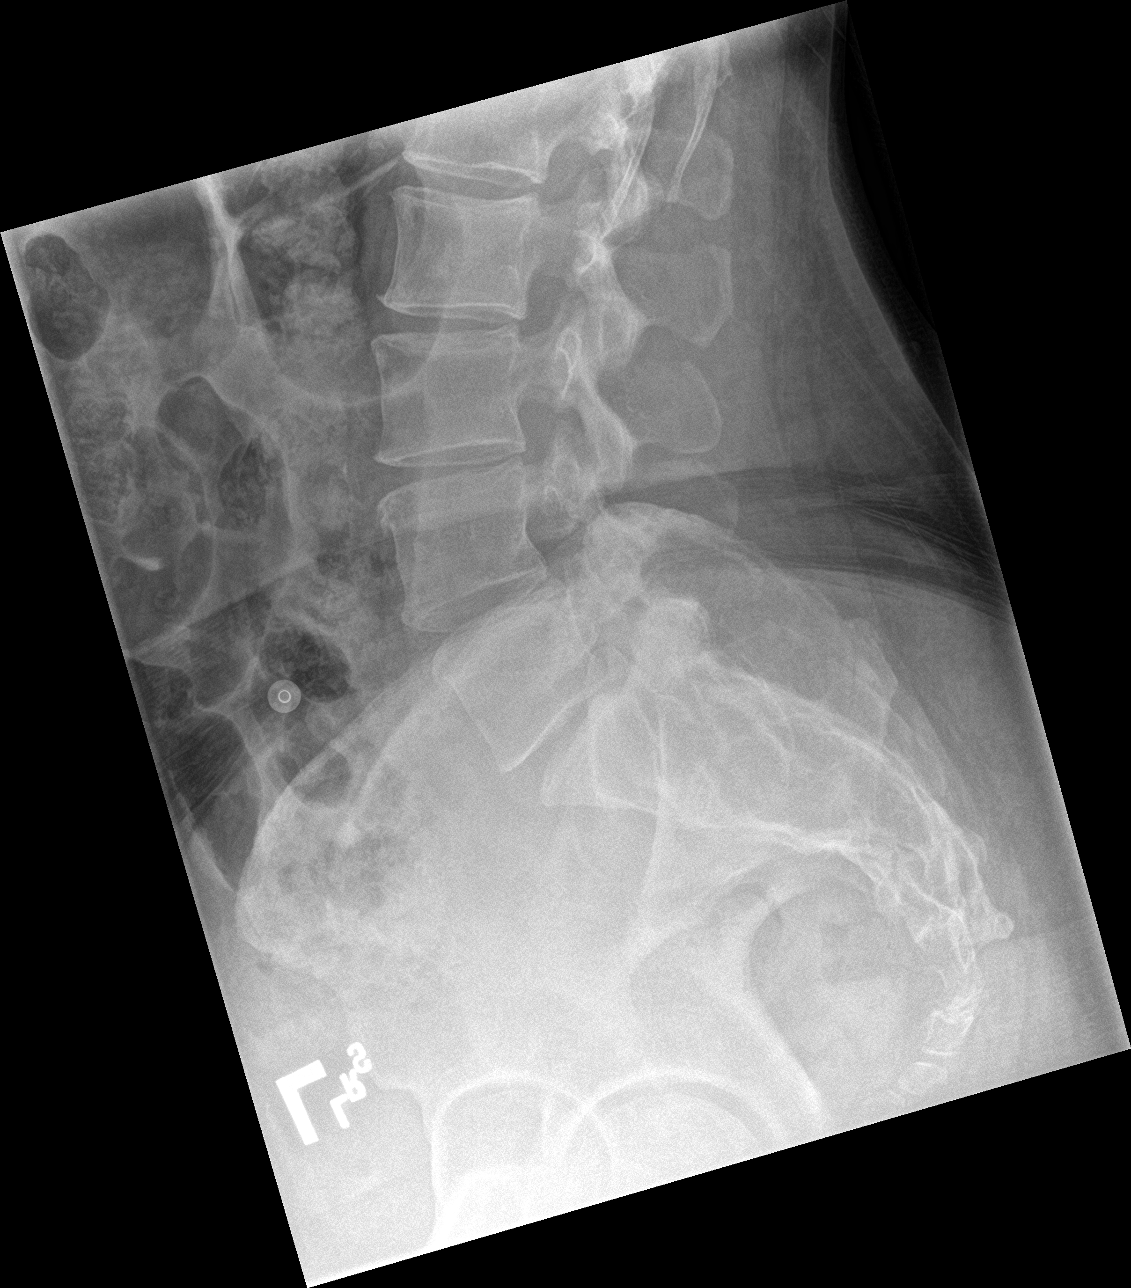

[5 of 5 positions shown; findings below may reference images not displayed]

FINDINGS: Five lumbar type vertebral bodies. Mild lumbar levocurvature. Slight
L4-5 anterolisthesis. No focal or notable disc degeneration. No
evidence of fracture or bone lesion.
IMPRESSION: 1. No acute finding.
2. Levocurvature and mild L4-5 anterolisthesis.

## 2020-07-05 ENCOUNTER — Encounter (HOSPITAL_BASED_OUTPATIENT_CLINIC_OR_DEPARTMENT_OTHER): Payer: Self-pay | Admitting: Orthopedic Surgery

## 2020-07-05 ENCOUNTER — Other Ambulatory Visit (HOSPITAL_COMMUNITY): Payer: Self-pay

## 2020-07-05 ENCOUNTER — Other Ambulatory Visit: Payer: Self-pay

## 2020-07-05 ENCOUNTER — Other Ambulatory Visit (HOSPITAL_COMMUNITY): Payer: Self-pay | Admitting: Orthopedic Surgery

## 2020-07-06 ENCOUNTER — Other Ambulatory Visit (HOSPITAL_COMMUNITY)
Admission: RE | Admit: 2020-07-06 | Discharge: 2020-07-06 | Disposition: A | Discharge status: Home / Self Care | Payer: BC Managed Care – PPO | Source: Ambulatory Visit | Attending: Orthopedic Surgery | Admitting: Orthopedic Surgery

## 2020-07-06 DIAGNOSIS — M7741 Metatarsalgia, right foot: Secondary | ICD-10-CM | POA: Diagnosis not present

## 2020-07-06 DIAGNOSIS — Z79899 Other long term (current) drug therapy: Secondary | ICD-10-CM | POA: Diagnosis not present

## 2020-07-06 DIAGNOSIS — T8484XA Pain due to internal orthopedic prosthetic devices, implants and grafts, initial encounter: Secondary | ICD-10-CM | POA: Diagnosis present

## 2020-07-06 DIAGNOSIS — M2041 Other hammer toe(s) (acquired), right foot: Secondary | ICD-10-CM | POA: Diagnosis not present

## 2020-07-06 DIAGNOSIS — Z01812 Encounter for preprocedural laboratory examination: Secondary | ICD-10-CM | POA: Insufficient documentation

## 2020-07-06 DIAGNOSIS — Z20822 Contact with and (suspected) exposure to covid-19: Secondary | ICD-10-CM | POA: Insufficient documentation

## 2020-07-06 DIAGNOSIS — Y793 Surgical instruments, materials and orthopedic devices (including sutures) associated with adverse incidents: Secondary | ICD-10-CM | POA: Diagnosis not present

## 2020-07-06 NOTE — Progress Notes (Signed)

## 2020-07-07 LAB — SARS CORONAVIRUS 2 (TAT 6-24 HRS): SARS Coronavirus 2: NEGATIVE

## 2020-07-08 ENCOUNTER — Ambulatory Visit (HOSPITAL_BASED_OUTPATIENT_CLINIC_OR_DEPARTMENT_OTHER): Payer: BC Managed Care – PPO | Admitting: Anesthesiology

## 2020-07-08 ENCOUNTER — Ambulatory Visit (HOSPITAL_BASED_OUTPATIENT_CLINIC_OR_DEPARTMENT_OTHER)
Admission: RE | Admit: 2020-07-08 | Discharge: 2020-07-08 | Disposition: A | Discharge status: Home / Self Care | Payer: BC Managed Care – PPO | Attending: Orthopedic Surgery | Admitting: Orthopedic Surgery

## 2020-07-08 ENCOUNTER — Encounter (HOSPITAL_BASED_OUTPATIENT_CLINIC_OR_DEPARTMENT_OTHER): Payer: Self-pay | Admitting: Orthopedic Surgery

## 2020-07-08 ENCOUNTER — Encounter (HOSPITAL_BASED_OUTPATIENT_CLINIC_OR_DEPARTMENT_OTHER)
Admission: RE | Disposition: A | Discharge status: Home / Self Care | Payer: Self-pay | Source: Home / Self Care | Attending: Orthopedic Surgery

## 2020-07-08 ENCOUNTER — Other Ambulatory Visit: Payer: Self-pay

## 2020-07-08 ENCOUNTER — Encounter (INDEPENDENT_AMBULATORY_CARE_PROVIDER_SITE_OTHER): Payer: Self-pay

## 2020-07-08 DIAGNOSIS — M7741 Metatarsalgia, right foot: Secondary | ICD-10-CM | POA: Insufficient documentation

## 2020-07-08 DIAGNOSIS — M2041 Other hammer toe(s) (acquired), right foot: Secondary | ICD-10-CM | POA: Insufficient documentation

## 2020-07-08 DIAGNOSIS — Z20822 Contact with and (suspected) exposure to covid-19: Secondary | ICD-10-CM | POA: Insufficient documentation

## 2020-07-08 DIAGNOSIS — T8484XA Pain due to internal orthopedic prosthetic devices, implants and grafts, initial encounter: Secondary | ICD-10-CM | POA: Diagnosis not present

## 2020-07-08 DIAGNOSIS — Y793 Surgical instruments, materials and orthopedic devices (including sutures) associated with adverse incidents: Secondary | ICD-10-CM | POA: Insufficient documentation

## 2020-07-08 DIAGNOSIS — Z79899 Other long term (current) drug therapy: Secondary | ICD-10-CM | POA: Insufficient documentation

## 2020-07-08 HISTORY — PX: HAMMERTOE RECONSTRUCTION WITH WEIL OSTEOTOMY: SHX5631

## 2020-07-08 HISTORY — PX: HARDWARE REMOVAL: SHX979

## 2020-07-08 SURGERY — HAMMERTOE RECONSTRUCTION WITH WEIL OSTEOTOMY
Anesthesia: General | Site: Foot | Laterality: Right

## 2020-07-08 MED ORDER — 0.9 % SODIUM CHLORIDE (POUR BTL) OPTIME
TOPICAL | Status: DC | PRN
  Administered 2020-07-08: 1000 mL

## 2020-07-08 MED ORDER — MIDAZOLAM HCL 2 MG/2ML IJ SOLN
INTRAMUSCULAR | Status: AC
  Filled 2020-07-08: qty 2

## 2020-07-08 MED ORDER — VANCOMYCIN HCL 500 MG IV SOLR
INTRAVENOUS | Status: DC | PRN
  Administered 2020-07-08: 500 mg via TOPICAL

## 2020-07-08 MED ORDER — OXYCODONE HCL 5 MG PO TABS: 5.0000 mg | ORAL_TABLET | Freq: Once | ORAL | Status: DC | PRN

## 2020-07-08 MED ORDER — PROPOFOL 500 MG/50ML IV EMUL
INTRAVENOUS | Status: DC | PRN
  Administered 2020-07-08: 35 ug/kg/min via INTRAVENOUS

## 2020-07-08 MED ORDER — ROPIVACAINE HCL 5 MG/ML IJ SOLN
INTRAMUSCULAR | Status: DC | PRN
  Administered 2020-07-08: 20 mL via PERINEURAL
  Administered 2020-07-08: 30 mL via PERINEURAL

## 2020-07-08 MED ORDER — AMISULPRIDE (ANTIEMETIC) 5 MG/2ML IV SOLN: 10.0000 mg | Freq: Once | INTRAVENOUS | Status: DC | PRN

## 2020-07-08 MED ORDER — SODIUM CHLORIDE 0.9 % IV SOLN: INTRAVENOUS | Status: DC

## 2020-07-08 MED ORDER — OXYCODONE HCL 5 MG PO TABS: 5.0000 mg | ORAL_TABLET | Freq: Four times a day (QID) | ORAL | 0 refills | Status: AC | PRN

## 2020-07-08 MED ORDER — OXYCODONE HCL 5 MG/5ML PO SOLN: 5.0000 mg | Freq: Once | ORAL | Status: DC | PRN

## 2020-07-08 MED ORDER — MIDAZOLAM HCL 2 MG/2ML IJ SOLN
2.0000 mg | Freq: Once | INTRAMUSCULAR | Status: AC
  Administered 2020-07-08: 2 mg via INTRAVENOUS

## 2020-07-08 MED ORDER — LIDOCAINE 2% (20 MG/ML) 5 ML SYRINGE
INTRAMUSCULAR | Status: DC | PRN
  Administered 2020-07-08: 30 mg via INTRAVENOUS

## 2020-07-08 MED ORDER — FENTANYL CITRATE (PF) 100 MCG/2ML IJ SOLN
100.0000 ug | Freq: Once | INTRAMUSCULAR | Status: AC
  Administered 2020-07-08: 50 ug via INTRAVENOUS

## 2020-07-08 MED ORDER — FENTANYL CITRATE (PF) 100 MCG/2ML IJ SOLN: 25.0000 ug | INTRAMUSCULAR | Status: DC | PRN

## 2020-07-08 MED ORDER — CEFAZOLIN SODIUM-DEXTROSE 2-4 GM/100ML-% IV SOLN
INTRAVENOUS | Status: AC
  Filled 2020-07-08: qty 100

## 2020-07-08 MED ORDER — LACTATED RINGERS IV SOLN: INTRAVENOUS | Status: DC

## 2020-07-08 MED ORDER — EPHEDRINE SULFATE 50 MG/ML IJ SOLN
INTRAMUSCULAR | Status: DC | PRN
  Administered 2020-07-08 (×2): 10 mg via INTRAVENOUS

## 2020-07-08 MED ORDER — FENTANYL CITRATE (PF) 100 MCG/2ML IJ SOLN
INTRAMUSCULAR | Status: AC
  Filled 2020-07-08: qty 2

## 2020-07-08 MED ORDER — ONDANSETRON HCL 4 MG/2ML IJ SOLN: 4.0000 mg | Freq: Once | INTRAMUSCULAR | Status: DC | PRN

## 2020-07-08 MED ORDER — CEFAZOLIN SODIUM-DEXTROSE 2-4 GM/100ML-% IV SOLN
2.0000 g | INTRAVENOUS | Status: AC
  Administered 2020-07-08: 2 g via INTRAVENOUS

## 2020-07-08 MED ORDER — PROPOFOL 10 MG/ML IV BOLUS
INTRAVENOUS | Status: DC | PRN
  Administered 2020-07-08: 200 mg via INTRAVENOUS

## 2020-07-08 SURGICAL SUPPLY — 68 items
BANDAGE ESMARK 6X9 LF (GAUZE/BANDAGES/DRESSINGS) IMPLANT
BIT DRILL CANN 2.4 (BIT) ×2
BIT DRILL CANN MAX VPC 2.4 (BIT) ×1 IMPLANT
BLADE AVERAGE 25X9 (BLADE) ×2 IMPLANT
BLADE LONG MED 25X9 (BLADE) IMPLANT
BLADE OSC/SAG .038X5.5 CUT EDG (BLADE) IMPLANT
BLADE SURG 15 STRL LF DISP TIS (BLADE) ×3 IMPLANT
BLADE SURG 15 STRL SS (BLADE) ×3
BNDG COHESIVE 4X5 TAN STRL (GAUZE/BANDAGES/DRESSINGS) ×2 IMPLANT
BNDG CONFORM 2 STRL LF (GAUZE/BANDAGES/DRESSINGS) IMPLANT
BNDG CONFORM 3 STRL LF (GAUZE/BANDAGES/DRESSINGS) ×2 IMPLANT
BNDG ESMARK 4X9 LF (GAUZE/BANDAGES/DRESSINGS) IMPLANT
BNDG ESMARK 6X9 LF (GAUZE/BANDAGES/DRESSINGS)
CAP PIN PROTECTOR ORTHO WHT (CAP) IMPLANT
CHLORAPREP W/TINT 26 (MISCELLANEOUS) ×2 IMPLANT
COVER BACK TABLE 60X90IN (DRAPES) ×2 IMPLANT
COVER WAND RF STERILE (DRAPES) IMPLANT
CUFF TOURN SGL QUICK 34 (TOURNIQUET CUFF) ×1
CUFF TRNQT CYL 34X4.125X (TOURNIQUET CUFF) ×1 IMPLANT
DRAPE EXTREMITY T 121X128X90 (DISPOSABLE) ×2 IMPLANT
DRAPE OEC MINIVIEW 54X84 (DRAPES) ×2 IMPLANT
DRAPE U-SHAPE 47X51 STRL (DRAPES) ×2 IMPLANT
DRSG MEPITEL 4X7.2 (GAUZE/BANDAGES/DRESSINGS) ×2 IMPLANT
DRSG PAD ABDOMINAL 8X10 ST (GAUZE/BANDAGES/DRESSINGS) ×4 IMPLANT
ELECT REM PT RETURN 9FT ADLT (ELECTROSURGICAL) ×2
ELECTRODE REM PT RTRN 9FT ADLT (ELECTROSURGICAL) ×1 IMPLANT
GAUZE SPONGE 4X4 12PLY STRL (GAUZE/BANDAGES/DRESSINGS) ×2 IMPLANT
GLOVE SRG 8 PF TXTR STRL LF DI (GLOVE) ×2 IMPLANT
GLOVE SURG ENC MOIS LTX SZ8 (GLOVE) ×2 IMPLANT
GLOVE SURG LTX SZ8 (GLOVE) ×2 IMPLANT
GLOVE SURG UNDER POLY LF SZ8 (GLOVE) ×2
GOWN STRL REUS W/ TWL LRG LVL3 (GOWN DISPOSABLE) ×1 IMPLANT
GOWN STRL REUS W/ TWL XL LVL3 (GOWN DISPOSABLE) ×2 IMPLANT
GOWN STRL REUS W/TWL LRG LVL3 (GOWN DISPOSABLE) ×1
GOWN STRL REUS W/TWL XL LVL3 (GOWN DISPOSABLE) ×2
K-WIRE .054X4 (WIRE) IMPLANT
K-WIRE COCR 1.1X105 (WIRE) ×2
KWIRE COCR 1.1X105 (WIRE) ×1 IMPLANT
NEEDLE HYPO 22GX1.5 SAFETY (NEEDLE) IMPLANT
NS IRRIG 1000ML POUR BTL (IV SOLUTION) ×2 IMPLANT
PACK BASIN DAY SURGERY FS (CUSTOM PROCEDURE TRAY) ×2 IMPLANT
PAD CAST 4YDX4 CTTN HI CHSV (CAST SUPPLIES) ×1 IMPLANT
PADDING CAST ABS 4INX4YD NS (CAST SUPPLIES)
PADDING CAST ABS COTTON 4X4 ST (CAST SUPPLIES) IMPLANT
PADDING CAST COTTON 4X4 STRL (CAST SUPPLIES) ×1
PASSER SUT SWANSON 36MM LOOP (INSTRUMENTS) IMPLANT
PENCIL SMOKE EVACUATOR (MISCELLANEOUS) ×2 IMPLANT
SANITIZER HAND PURELL 535ML FO (MISCELLANEOUS) ×2 IMPLANT
SCREW HCS TWIST-OFF 2.0X12MM (Screw) ×4 IMPLANT
SCREW VPC 3.4X16 (Screw) ×4 IMPLANT
SHEET MEDIUM DRAPE 40X70 STRL (DRAPES) ×2 IMPLANT
SLEEVE SCD COMPRESS KNEE MED (STOCKING) ×2 IMPLANT
SPONGE LAP 18X18 RF (DISPOSABLE) ×2 IMPLANT
STOCKINETTE 6  STRL (DRAPES) ×1
STOCKINETTE 6 STRL (DRAPES) ×1 IMPLANT
SUCTION FRAZIER HANDLE 10FR (MISCELLANEOUS) ×1
SUCTION TUBE FRAZIER 10FR DISP (MISCELLANEOUS) ×1 IMPLANT
SUT ETHILON 3 0 PS 1 (SUTURE) ×6 IMPLANT
SUT MNCRL AB 3-0 PS2 18 (SUTURE) ×2 IMPLANT
SUT VIC AB 2-0 SH 27 (SUTURE)
SUT VIC AB 2-0 SH 27XBRD (SUTURE) IMPLANT
SUT VICRYL 0 UR6 27IN ABS (SUTURE) ×2 IMPLANT
SYR BULB EAR ULCER 3OZ GRN STR (SYRINGE) ×2 IMPLANT
SYR CONTROL 10ML LL (SYRINGE) IMPLANT
TOWEL GREEN STERILE FF (TOWEL DISPOSABLE) ×2 IMPLANT
TUBE CONNECTING 20X1/4 (TUBING) ×2 IMPLANT
UNDERPAD 30X36 HEAVY ABSORB (UNDERPADS AND DIAPERS) ×2 IMPLANT
YANKAUER SUCT BULB TIP NO VENT (SUCTIONS) ×2 IMPLANT

## 2020-07-08 NOTE — Anesthesia Procedure Notes (Signed)
Anesthesia Regional Block: Adductor canal block   Pre-Anesthetic Checklist: ,, timeout performed, Correct Patient, Correct Site, Correct Laterality, Correct Procedure, Correct Position, site marked, Risks and benefits discussed,  Surgical consent,  Pre-op evaluation,  At surgeon's request and post-op pain management  Laterality: Right  Prep: chloraprep       Needles:  Injection technique: Single-shot  Needle Type: Echogenic Stimulator Needle     Needle Length: 10cm  Needle Gauge: 20     Additional Needles:   Procedures:,,,, ultrasound used (permanent image in chart),,,,  Narrative:  Start time: 07/08/2020 1:52 PM End time: 07/08/2020 1:55 PM Injection made incrementally with aspirations every 5 mL.  Performed by: Personally  Anesthesiologist: Lidia Collum, MD  Additional Notes: Standard monitors applied. Skin prepped. Good needle visualization with ultrasound. Injection made in 5cc increments with no resistance to injection. Patient tolerated the procedure well.

## 2020-07-08 NOTE — Anesthesia Postprocedure Evaluation (Signed)
Anesthesia Post Note  Patient: Kaitlyn Reid  Procedure(s) Performed: 2-3 weil and hammertoe correction (Right Foot) Painful hardware removal right medial cuneiform (Right Foot)     Patient location during evaluation: PACU Anesthesia Type: General and Regional Level of consciousness: awake and alert, oriented and patient cooperative Pain management: pain level controlled Vital Signs Assessment: post-procedure vital signs reviewed and stable Respiratory status: spontaneous breathing, nonlabored ventilation and respiratory function stable Cardiovascular status: blood pressure returned to baseline and stable Postop Assessment: no apparent nausea or vomiting Anesthetic complications: no   No complications documented.  Last Vitals:  Vitals:   07/08/20 1630 07/08/20 1645  BP: 102/65 121/76  Pulse: (!) 58 70  Resp: 18 (!) 22  Temp:    SpO2: 99% 99%    Last Pain:  Vitals:   07/08/20 1628  TempSrc:   PainSc: [place]

## 2020-07-08 NOTE — Progress Notes (Signed)
Assisted Dr. Christella Hartigan with right, ultrasound guided, popliteal, and adductor canal block. Side rails up, monitors on throughout procedure. See vital signs in flow sheet. Tolerated Procedure well.

## 2020-07-08 NOTE — Anesthesia Procedure Notes (Signed)
Procedure Name: LMA Insertion Date/Time: 07/08/2020 3:45 PM Performed by: Signe Colt, CRNA Pre-anesthesia Checklist: Patient identified, Emergency Drugs available, Suction available and Patient being monitored Patient Re-evaluated:Patient Re-evaluated prior to induction Oxygen Delivery Method: Circle System Utilized Preoxygenation: Pre-oxygenation with 100% oxygen Induction Type: IV induction Ventilation: Mask ventilation without difficulty LMA: LMA inserted LMA Size: 4.0 Number of attempts: 1 Airway Equipment and Method: bite block Placement Confirmation: positive ETCO2 Tube secured with: Tape Dental Injury: Teeth and Oropharynx as per pre-operative assessment

## 2020-07-08 NOTE — Anesthesia Procedure Notes (Signed)
Anesthesia Regional Block: Popliteal block   Pre-Anesthetic Checklist: ,, timeout performed, Correct Patient, Correct Site, Correct Laterality, Correct Procedure, Correct Position, site marked, Risks and benefits discussed,  Surgical consent,  Pre-op evaluation,  At surgeon's request and post-op pain management  Laterality: Right  Prep: chloraprep       Needles:  Injection technique: Single-shot  Needle Type: Echogenic Stimulator Needle     Needle Length: 10cm  Needle Gauge: 20     Additional Needles:   Procedures:,,,, ultrasound used (permanent image in chart),,,,  Narrative:  Start time: 07/08/2020 1:55 PM End time: 07/08/2020 1:57 PM  Performed by: Personally  Anesthesiologist: Lidia Collum, MD  Additional Notes: Standard monitors applied. Skin prepped. Good needle visualization with ultrasound. Injection made in 5cc increments with no resistance to injection. Patient tolerated the procedure well.

## 2020-07-08 NOTE — Anesthesia Preprocedure Evaluation (Addendum)
Anesthesia Evaluation  Patient identified by MRN, date of birth, ID band Patient awake    Reviewed: Allergy & Precautions, NPO status , Patient's Chart, lab work & pertinent test results  History of Anesthesia Complications Negative for: history of anesthetic complications  Airway Mallampati: I  TM Distance: >3 FB Neck ROM: Full    Dental  (+) Teeth Intact   Pulmonary sleep apnea and Continuous Positive Airway Pressure Ventilation ,    Pulmonary exam normal        Cardiovascular negative cardio ROS Normal cardiovascular exam     Neuro/Psych Anxiety Depression negative neurological ROS     GI/Hepatic negative GI ROS, Neg liver ROS,   Endo/Other  negative endocrine ROS  Renal/GU negative Renal ROS  negative genitourinary   Musculoskeletal negative musculoskeletal ROS (+)   Abdominal   Peds  Hematology negative hematology ROS (+)   Anesthesia Other Findings   Reproductive/Obstetrics                            Anesthesia Physical Anesthesia Plan  ASA: II  Anesthesia Plan: General   Post-op Pain Management: GA combined w/ Regional for post-op pain   Induction: Intravenous  PONV Risk Score and Plan: 3 and Ondansetron, Dexamethasone, Midazolam and Treatment may vary due to age or medical condition  Airway Management Planned: LMA  Additional Equipment: None  Intra-op Plan:   Post-operative Plan: Extubation in OR  Informed Consent: I have reviewed the patients History and Physical, chart, labs and discussed the procedure including the risks, benefits and alternatives for the proposed anesthesia with the patient or authorized representative who has indicated his/her understanding and acceptance.     Dental advisory given  Plan Discussed with:   Anesthesia Plan Comments:        Anesthesia Quick Evaluation

## 2020-07-08 NOTE — Op Note (Signed)
07/08/2020  4:24 PM  PATIENT:  Kaitlyn Reid  50 y.o. female  PRE-OPERATIVE DIAGNOSIS:  Right foot painful hardware, right second and third hammertoes and metatarsalgia  POST-OPERATIVE DIAGNOSIS:  Right foot painful hardware, right second and third hammertoes and metatarsalgia  Procedure(s): 1.  Removal of deep implants from the medial cuneiform 2.  Right second and third metatarsal Weil osteotomies 3.  Percutaneous tenotomy of the right second and third toe flexor digitorum longus tendons 4.  Right second and third hammertoe corrections (PIP arthrodesis and flexor to extensor transfer) 5.  AP, lateral and oblique radiographs of the right foot  SURGEON:  Wylene Simmer, MD  ASSISTANT: None  ANESTHESIA:   General, regional  EBL:  minimal   TOURNIQUET:   Total Tourniquet Time Documented: Thigh (Right) - 44 minutes Total: Thigh (Right) - 44 minutes  COMPLICATIONS:  None apparent  DISPOSITION:  Extubated, awake and stable to recovery.  INDICATION FOR PROCEDURE: The patient is a 50 year old female with a history of a right foot Lisfranc injury status post ORIF.  She has painful hardware at the right medial forefoot.  She has also developed painful second and third hammertoe deformities that are not passively correctable.  She has signs and symptoms of metatarsalgia as well.  She has failed nonoperative treatment to date including activity modification, oral anti-inflammatories and shoewear modification.  She presents today for surgical treatment of his painful right foot conditions.  The risks and benefits of the alternative treatment options have been discussed in detail.  The patient wishes to proceed with surgery and specifically understands risks of bleeding, infection, nerve damage, blood clots, need for additional surgery, amputation and death.  PROCEDURE IN DETAIL:  After pre operative consent was obtained, and the correct operative site was identified, the patient was brought to the  operating room and placed supine on the OR table.  Anesthesia was administered.  Pre-operative antibiotics were administered.  A surgical timeout was taken.  The right lower extremity was prepped and draped in standard sterile fashion with a tourniquet around the thigh.  The extremity was elevated and the tourniquet was inflated to 250 mmHg.  The medial incision over the cuneiforms was identified.  It was opened again sharply and dissection carried down through the subcutaneous tissues.  The tibialis anterior tendon was protected.  The proximal screw head was identified.  The screw was cleaned of all soft tissue and removed without difficulty.  The distal screw head was then identified and screw removed in the same fashion.  AP and lateral radiographs confirmed complete removal of both screws.  Wound was irrigated sprinkled with vancomycin powder.  It was closed with nylon sutures.  Attention was turned to the forefoot.  A longitudinal incision was made over the second MTP joint.  Dissection was carried down through the subcutaneous tissues.  The extensor tendons were lengthened and the dorsal joint capsule excised.  Same procedure was then performed for the third MTP joint.  A percutaneous flexor digitorum longus tendon release was performed.  The same was performed for the third toe.  An oblique incision was then made over the MTP joint plantarly.  Dissection was carried down through the subcutaneous tissues.  The neurovascular bundles were protected.  The flexor tendon sheath was incised.  The flexor digitorum longus tendon was pulled out through the plantar incision and tagged.  The same procedure was performed for the third toe.  Attention was returned to the dorsum of the foot.  A Weil osteotomy  was made at the second metatarsal.  The head of the metatarsal was allowed to retract several millimeters and was fixed with a 2 mm Zimmer Biomet FRS screw.  Overhanging bone was trimmed with a rondure.  A Weil  osteotomy was then made in the same fashion at the third metatarsal head.  A transverse incision was then made over the second toe PIP joint.  Dissection was carried down through the skin and extensor mechanism.  The head of the proximal phalanx was resected followed by the base of the middle phalanx.  The joint was reduced and fixed with a 3.4 mm Zimmer Biomet VPC screw.  Same procedure was then performed for the third toe.  The second MTP joint was reduced.  The extensor tendon was passed from the plantar incision to the dorsal incision along the proximal phalanx in subperiosteal fashion.  The flexor tendon was repaired to the extensor tendon with figure-of-eight sutures of 0 Vicryl.  Same procedure was then performed for the third toe.  Final AP, oblique and lateral radiographs confirmed appropriate shortening of the second and third metatarsals and correction of the second and third hammertoe deformities.  Interval removal of screws from the medial cuneiform are also noted.  Wounds were irrigated copiously and sprinkled with vancomycin powder.  Skin incisions were closed horizontal mattress sutures of 3-0 nylon.  Sterile dressings were applied followed by a well-padded compression dressing.  The tourniquet was released prior to closure of the wounds and hemostasis was achieved.  Patient was awakened from anesthesia and transported to the recovery room in stable condition.   FOLLOW UP PLAN: Weightbearing as tolerated in a flat postop shoe.  Follow-up in the office in 2 weeks for suture removal.  Plan weightbearing immobilization for 6 weeks postop.   RADIOGRAPHS: AP, oblique and lateral radiographs of the right foot are obtained intraoperatively.  These show interval correction of the second and third hammertoe deformities with shortening of the second and third metatarsals as well as interval removal of the screws from the medial cuneiform.  Hardware is appropriately positioned and of the appropriate  lengths.

## 2020-07-08 NOTE — Transfer of Care (Signed)
Immediate Anesthesia Transfer of Care Note  Patient: Kaitlyn Reid  Procedure(s) Performed: 2-3 weil and hammertoe correction (Right Foot) Painful hardware removal right medial cuneiform (Right Foot)  Patient Location: PACU  Anesthesia Type:GA combined with regional for post-op pain  Level of Consciousness: sedated  Airway & Oxygen Therapy: Patient Spontanous Breathing and Patient connected to face mask oxygen  Post-op Assessment: Report given to RN and Post -op Vital signs reviewed and stable  Post vital signs: Reviewed and stable  Last Vitals:  Vitals Value Taken Time  BP 102/65 07/08/20 1630  Temp    Pulse 57 07/08/20 1631  Resp 17 07/08/20 1631  SpO2 99 % 07/08/20 1631  Vitals shown include unvalidated device data.  Last Pain:  Vitals:   07/08/20 1156  TempSrc: Oral  PainSc: 4       Patients Stated Pain Goal: 5 (40/34/74 2595)  Complications: No complications documented.

## 2020-07-08 NOTE — Discharge Instructions (Signed)
Kaitlyn Simmer, MD [facility]  Please read the following information regarding your care after surgery.  Medications  You only need a prescription for the narcotic pain medicine (ex. oxycodone, Percocet, Norco).  All of the other medicines listed below are available over the counter. X Aleve 2 pills twice a day for the first 3 days after surgery. X acetominophen (Tylenol) 650 mg every 4-6 hours as you need for minor to moderate pain X oxycodone as prescribed for severe pain  Narcotic pain medicine (ex. oxycodone, Percocet, Vicodin) will cause constipation.  To prevent this problem, take the following medicines while you are taking any pain medicine. X docusate sodium (Colace) 100 mg twice a day X senna (Senokot) 2 tablets twice a day  ? To help prevent blood clots, take a baby aspirin (81 mg) twice a day for two weeks after surgery.  You should also get up every hour while you are awake to move around.    Weight Bearing ? Bear weight when you are able on your operated leg or foot. X Bear weight only on your operated foot in the post-op shoe. ? Do not bear any weight on the operated leg or foot.  Cast / Splint / Dressing X Keep your splint, cast or dressing clean and dry.  Don't put anything (coat hanger, pencil, etc) down inside of it.  If it gets damp, use a hair dryer on the cool setting to dry it.  If it gets soaked, call the office to schedule an appointment for a cast change. ? Remove your dressing 3 days after surgery and cover the incisions with dry dressings.    After your dressing, cast or splint is removed; you may shower, but do not soak or scrub the wound.  Allow the water to run over it, and then gently pat it dry.  Swelling It is normal for you to have swelling where you had surgery.  To reduce swelling and pain, keep your toes above your nose for at least 3 days after surgery.  It may be necessary to keep your foot or leg elevated for several weeks.  If it hurts, it should be  elevated.  Follow Up Call my office at [phone] when you are discharged from the hospital or surgery center to schedule an appointment to be seen two weeks after surgery.  Call my office at [phone] if you develop a fever >101.5 F, nausea, vomiting, bleeding from the surgical site or severe pain.     Post Anesthesia Home Care Instructions  Activity: Get plenty of rest for the remainder of the day. A responsible individual must stay with you for 24 hours following the procedure.  For the next 24 hours, DO NOT: -Drive a car -Paediatric nurse -Drink alcoholic beverages -Take any medication unless instructed by your physician -Make any legal decisions or sign important papers.  Meals: Start with liquid foods such as gelatin or soup. Progress to regular foods as tolerated. Avoid greasy, spicy, heavy foods. If nausea and/or vomiting occur, drink only clear liquids until the nausea and/or vomiting subsides. Call your physician if vomiting continues.  Special Instructions/Symptoms: Your throat may feel dry or sore from the anesthesia or the breathing tube placed in your throat during surgery. If this causes discomfort, gargle with warm salt water. The discomfort should disappear within 24 hours.  If you had a scopolamine patch placed behind your ear for the management of post- operative nausea and/or vomiting:  1. The medication in the patch is  effective for 72 hours, after which it should be removed.  Wrap patch in a tissue and discard in the trash. Wash hands thoroughly with soap and water. 2. You may remove the patch earlier than 72 hours if you experience unpleasant side effects which may include dry mouth, dizziness or visual disturbances. 3. Avoid touching the patch. Wash your hands with soap and water after contact with the patch.    Regional Anesthesia Blocks  1. Numbness or the inability to move the "blocked" extremity may last from 3-48 hours after placement. The length  of time depends on the medication injected and your individual response to the medication. If the numbness is not going away after 48 hours, call your surgeon.  2. The extremity that is blocked will need to be protected until the numbness is gone and the  Strength has returned. Because you cannot feel it, you will need to take extra care to avoid injury. Because it may be weak, you may have difficulty moving it or using it. You may not know what position it is in without looking at it while the block is in effect.  3. For blocks in the legs and feet, returning to weight bearing and walking needs to be done carefully. You will need to wait until the numbness is entirely gone and the strength has returned. You should be able to move your leg and foot normally before you try and bear weight or walk. You will need someone to be with you when you first try to ensure you do not fall and possibly risk injury.  4. Bruising and tenderness at the needle site are common side effects and will resolve in a few days.  5. Persistent numbness or new problems with movement should be communicated to the surgeon or the [facility] ([phone])/ [facility] ([phone]). 

## 2020-07-08 NOTE — H&P (Signed)
Kaitlyn Reid is an 50 y.o. female.   Chief Complaint: Right foot pain HPI: The patient is a 50 year old female who is status post Lisfranc ORIF in the remote past.  She has pain at the medial hardware.  She also has symptomatic second and third hammertoe deformities with metatarsalgia.  She has failed nonoperative treatment to date including activity modification, oral anti-inflammatories and shoewear modification.  She presents today for surgical treatment of these painful right foot conditions.  Past Medical History:  Diagnosis Date  . Anemia   . Anxiety   . Complication of anesthesia 1980s   woke up with headaches postop  . Depression   . Fibroids   . Headache(784.0)    migraines  . 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 Sleep apnea    uses a CPAP    Past Surgical History:  Procedure Laterality Date  . ablation  2011   uterine  . ANKLE RECONSTRUCTION  2009   R  . CYSTOSCOPY N/A 05/28/2012   Procedure: CYSTOSCOPY;  Surgeon: Kaitlyn Sarah. Landry Mellow, MD;  Location: [facility];  Service: Gynecology;  Laterality: N/A;  . DILATION AND CURETTAGE OF UTERUS    . LIPOMA EXCISION    . ROBOTIC ASSISTED TOTAL HYSTERECTOMY N/A 05/28/2012   Procedure: ROBOTIC ASSISTED TOTAL HYSTERECTOMY;  Surgeon: Kaitlyn Sarah. Landry Mellow, MD;  Location: [facility];  Service: Gynecology;  Laterality: N/A;    History reviewed. No pertinent family history. Social History:  reports that she has never smoked. She has never used smokeless tobacco. She reports current alcohol use of about 6.0 standard drinks of alcohol per week. She reports that she does not use drugs.  Allergies: No Known Allergies  Medications Prior to Admission  Medication Sig Dispense Refill  . acetaminophen (TYLENOL) 325 MG tablet Take 1 tablet (325 mg total) by mouth every 6 (six) hours as needed. 20 tablet 0  . ALPRAZolam (XANAX) 0.25 MG tablet Take 0.5 mg by mouth once.    Marland Kitchen buPROPion (WELLBUTRIN) 100 MG tablet Take 150 mg by  mouth daily.    . Cholecalciferol (VITAMIN D PO) Take 1 capsule by mouth daily. 1200mg  daily    . ibuprofen (MOTRIN IB) 800 MG tablet Take 1 tablet (800 mg total) by mouth every 8 (eight) hours as needed for pain. 30 tablet 1  . Multiple Vitamin (MULTIVITAMIN WITH MINERALS) TABS Take 1 tablet by mouth daily.    . valACYclovir (VALTREX) 500 MG tablet Take 500 mg by mouth daily.      No results found for this or any previous visit (from the past 48 hour(s)). No results found.  Review of Systems no recent fever, chills, nausea, vomiting or changes in her appetite  Blood pressure 117/60, pulse (!) 59, temperature (!) 97 F (36.1 C), temperature source Oral, resp. rate 16, height 5\' 10"  (1.778 m), weight 107 kg, last menstrual period 12/22/2011, SpO2 97 %. Physical Exam  [name] in no apparent distress.  Alert and oriented x4.  Normal mood and affect.  Gait is normal.  The right foot has central hammertoe deformities.  The second and third cannot be passively corrected.  She has increased callus beneath the second and third metatarsal heads.  The screws in the medial cuneiform are tender to palpation medially at the midfoot.   Assessment/Plan Painful hardware right midfoot, right forefoot metatarsalgia and second and third hammertoe deformities - to the operating room today for hardware removal as well as  second and third metatarsal Weil osteotomies and hammertoe corrections.  The risks and benefits of the alternative treatment options have been discussed in detail.  The patient wishes to proceed with surgery and specifically understands risks of bleeding, infection, nerve damage, blood clots, need for additional surgery, amputation and death.    Kaitlyn Simmer, MD 07-23-20, 3:06 PM

## 2020-07-12 ENCOUNTER — Encounter (HOSPITAL_BASED_OUTPATIENT_CLINIC_OR_DEPARTMENT_OTHER): Payer: Self-pay | Admitting: Orthopedic Surgery

## 2023-10-17 ENCOUNTER — Other Ambulatory Visit: Payer: Self-pay | Admitting: Medical Genetics

## 2023-11-06 ENCOUNTER — Other Ambulatory Visit (HOSPITAL_COMMUNITY): Payer: Self-pay

## 2024-01-14 ENCOUNTER — Other Ambulatory Visit: Payer: Self-pay | Admitting: Medical Genetics

## 2024-01-14 DIAGNOSIS — Z006 Encounter for examination for normal comparison and control in clinical research program: Secondary | ICD-10-CM

## 2024-02-04 LAB — GENECONNECT MOLECULAR SCREEN: Genetic Analysis Overall Interpretation: NEGATIVE
